# Patient Record
Sex: Female | Born: 1955 | Race: White | Hispanic: No | Marital: Married | Smoking: Never smoker
Health system: Southern US, Community
[De-identification: ages and names within clinical notes are randomized; demographics above are authoritative.]

## PROBLEM LIST (undated history)

## (undated) DIAGNOSIS — F419 Anxiety disorder, unspecified: Secondary | ICD-10-CM

## (undated) DIAGNOSIS — T8859XA Other complications of anesthesia, initial encounter: Secondary | ICD-10-CM

## (undated) DIAGNOSIS — T4145XA Adverse effect of unspecified anesthetic, initial encounter: Secondary | ICD-10-CM

## (undated) DIAGNOSIS — M1990 Unspecified osteoarthritis, unspecified site: Secondary | ICD-10-CM

## (undated) HISTORY — PX: ABDOMINAL HYSTERECTOMY: SHX81

## (undated) HISTORY — PX: TOTAL KNEE ARTHROPLASTY: SHX125

## (undated) HISTORY — PX: CHOLECYSTECTOMY: SHX55

---
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
## 1997-11-13 ENCOUNTER — Ambulatory Visit (HOSPITAL_COMMUNITY): Admission: RE | Admit: 1997-11-13 | Discharge: 1997-11-13 | Payer: Self-pay | Admitting: Family Medicine

## 1998-11-22 ENCOUNTER — Ambulatory Visit (HOSPITAL_COMMUNITY): Admission: RE | Admit: 1998-11-22 | Discharge: 1998-11-22 | Payer: Self-pay | Admitting: Obstetrics and Gynecology

## 1998-11-22 ENCOUNTER — Encounter: Payer: Self-pay | Admitting: Obstetrics and Gynecology

## 1999-05-13 ENCOUNTER — Encounter: Payer: Self-pay | Admitting: Family Medicine

## 1999-05-13 ENCOUNTER — Ambulatory Visit (HOSPITAL_COMMUNITY): Admission: RE | Admit: 1999-05-13 | Discharge: 1999-05-13 | Payer: Self-pay | Admitting: Family Medicine

## 1999-11-17 ENCOUNTER — Other Ambulatory Visit: Admission: RE | Admit: 1999-11-17 | Discharge: 1999-11-17 | Payer: Self-pay | Admitting: Obstetrics and Gynecology

## 1999-12-23 ENCOUNTER — Encounter: Payer: Self-pay | Admitting: Family Medicine

## 1999-12-23 ENCOUNTER — Ambulatory Visit (HOSPITAL_COMMUNITY): Admission: RE | Admit: 1999-12-23 | Discharge: 1999-12-23 | Payer: Self-pay | Admitting: Family Medicine

## 2000-03-12 ENCOUNTER — Encounter (INDEPENDENT_AMBULATORY_CARE_PROVIDER_SITE_OTHER): Payer: Self-pay

## 2000-03-12 ENCOUNTER — Ambulatory Visit (HOSPITAL_COMMUNITY): Admission: RE | Admit: 2000-03-12 | Discharge: 2000-03-12 | Payer: Self-pay | Admitting: Gastroenterology

## 2000-05-25 ENCOUNTER — Ambulatory Visit (HOSPITAL_COMMUNITY): Admission: RE | Admit: 2000-05-25 | Discharge: 2000-05-25 | Payer: Self-pay | Admitting: Family Medicine

## 2000-05-25 ENCOUNTER — Encounter: Payer: Self-pay | Admitting: Family Medicine

## 2000-11-19 ENCOUNTER — Other Ambulatory Visit: Admission: RE | Admit: 2000-11-19 | Discharge: 2000-11-19 | Payer: Self-pay | Admitting: Obstetrics and Gynecology

## 2000-12-24 ENCOUNTER — Ambulatory Visit (HOSPITAL_COMMUNITY): Admission: RE | Admit: 2000-12-24 | Discharge: 2000-12-24 | Payer: Self-pay | Admitting: Obstetrics and Gynecology

## 2000-12-24 ENCOUNTER — Encounter: Payer: Self-pay | Admitting: Obstetrics and Gynecology

## 2001-12-27 ENCOUNTER — Encounter: Payer: Self-pay | Admitting: Family Medicine

## 2001-12-27 ENCOUNTER — Ambulatory Visit (HOSPITAL_COMMUNITY): Admission: RE | Admit: 2001-12-27 | Discharge: 2001-12-27 | Payer: Self-pay | Admitting: Family Medicine

## 2002-06-19 ENCOUNTER — Ambulatory Visit (HOSPITAL_COMMUNITY): Admission: RE | Admit: 2002-06-19 | Discharge: 2002-06-19 | Payer: Self-pay | Admitting: Family Medicine

## 2002-06-19 ENCOUNTER — Encounter: Payer: Self-pay | Admitting: Family Medicine

## 2002-12-29 ENCOUNTER — Ambulatory Visit (HOSPITAL_COMMUNITY): Admission: RE | Admit: 2002-12-29 | Discharge: 2002-12-29 | Payer: Self-pay | Admitting: Family Medicine

## 2002-12-29 ENCOUNTER — Encounter: Payer: Self-pay | Admitting: Family Medicine

## 2004-01-05 ENCOUNTER — Ambulatory Visit (HOSPITAL_COMMUNITY): Admission: RE | Admit: 2004-01-05 | Discharge: 2004-01-05 | Payer: Self-pay | Admitting: Family Medicine

## 2005-01-05 ENCOUNTER — Ambulatory Visit (HOSPITAL_COMMUNITY): Admission: RE | Admit: 2005-01-05 | Discharge: 2005-01-05 | Payer: Self-pay | Admitting: Obstetrics and Gynecology

## 2005-01-15 ENCOUNTER — Emergency Department (HOSPITAL_COMMUNITY): Admission: EM | Admit: 2005-01-15 | Discharge: 2005-01-16 | Payer: Self-pay | Admitting: Emergency Medicine

## 2005-01-24 ENCOUNTER — Encounter (INDEPENDENT_AMBULATORY_CARE_PROVIDER_SITE_OTHER): Payer: Self-pay | Admitting: Specialist

## 2005-01-24 ENCOUNTER — Ambulatory Visit (HOSPITAL_COMMUNITY): Admission: RE | Admit: 2005-01-24 | Discharge: 2005-01-25 | Payer: Self-pay | Admitting: General Surgery

## 2006-01-08 ENCOUNTER — Ambulatory Visit (HOSPITAL_COMMUNITY): Admission: RE | Admit: 2006-01-08 | Discharge: 2006-01-08 | Payer: Self-pay | Admitting: Family Medicine

## 2006-01-19 ENCOUNTER — Encounter: Admission: RE | Admit: 2006-01-19 | Discharge: 2006-01-19 | Payer: Self-pay | Admitting: Family Medicine

## 2007-01-24 ENCOUNTER — Encounter: Admission: RE | Admit: 2007-01-24 | Discharge: 2007-01-24 | Payer: Self-pay | Admitting: Family Medicine

## 2008-01-27 ENCOUNTER — Encounter: Admission: RE | Admit: 2008-01-27 | Discharge: 2008-01-27 | Payer: Self-pay | Admitting: Family Medicine

## 2009-01-29 ENCOUNTER — Encounter: Admission: RE | Admit: 2009-01-29 | Discharge: 2009-01-29 | Payer: Self-pay | Admitting: Obstetrics and Gynecology

## 2010-01-31 ENCOUNTER — Encounter: Admission: RE | Admit: 2010-01-31 | Discharge: 2010-01-31 | Payer: Self-pay | Admitting: Obstetrics and Gynecology

## 2010-08-12 NOTE — Procedures (Signed)
[facility]  Patient:    Dorothy Garcia, Dorothy Garcia                     MRN: [id number] Proc. Date: 03/12/00 Adm. Date:  [id number] Attending:  Orland Mustard CC:         Arvella Merles, M.D.   Procedure Report  PROCEDURE:  Colonoscopy and coagulation of polyp.  MEDICATIONS:  Fentanyl 155 mcg, Versed 15 mg IV.  SCOPE:  Pediatric Olympus video colonoscope.  INDICATIONS:  A strong family history of colon polyps and cancer.  DESCRIPTION OF PROCEDURE:  The procedure had been explained to the patient and consent obtained.  With the patient in the left lateral decubitus, the pediatric video colonoscope was inserted and advanced under direct visualization.  The prep was excellent.  Using position changes and abdominal pressure, we were able to advance down to the cecum.  The right lower quadrant was transilluminated with the ileocecal valve seen.  The appendiceal orifice was seen as well as the cats claw configuration.  The scope was withdrawn. The cecum, ascending colon, hepatic flexure, transverse colon, splenic flexure, descending, and sigmoid colon were seen well upon removal with no significant diverticular disease.  Approximately 20 cm near the rectosigmoid junction, a 3-4 mm sessile polyp was encountered and was cauterized with hot biopsy forceps.  The scope was withdrawn.  The patient tolerated the procedure well and was awake and alert at the termination of procedure.  ASSESSMENT: 1. Rectal polyp. 2. Internal hemorrhoids.  PLAN:  Routine postpolypectomy instructions.  We will recommend repeating procedure in three years. DD:  03/12/00 TD:  03/13/00 Job: [id number]

## 2010-08-12 NOTE — Discharge Summary (Signed)
NAMEKRYSTYN, PICKING              ACCOUNT NO.:  [id number]   MEDICAL RECORD NO.:  [id number]            PATIENT TYPE:   LOCATION:                                 FACILITY:   PHYSICIAN:  Cherylynn Ridges, M.D.    DATE OF BIRTH:  Nov 17, 1955   DATE OF ADMISSION:  DATE OF DISCHARGE:                                 DISCHARGE SUMMARY   DISCHARGE DIAGNOSES:  1.  Symptomatic cholelithiasis, status post laparoscopic cholecystectomy.  2.  Postmenopausal, status post hysterectomy.  3.  Depression, treated.   ALLERGIES:   HOSPITAL COURSE:  Ms. Deluna is a 55 year old female who has a 2-week  history of right upper quadrant pain. Also has confirmed cholelithiasis.  She was seen in the office by Dr. Lindie Spruce on January 23, 2005 for increased  symptoms.  Because of her persistent symptoms she was scheduled for surgery  on January 24, 2005.  She was admitted to the hospital and underwent a  laparoscopic cholecystectomy by Dr. Jimmye Norman.  She was taken to the  recovery room in stable condition.  She remained in the hospital overnight  and her vital signs remained stable and she was ready for discharge to home.  She is discharged home in stable condition.   DISCHARGE MEDICATIONS:  1.  Tylenol, as needed, for pain.  2.  Vicodin if needed for pain.   FOLLOWUP:  She was given a home care instruction for laparoscopic  cholecystectomy sheet for home care information.  She is to return to the  office in 2 weeks.  I will set that appointment up with her.  She is not to  return to work until February 06, 2005.      Guy Franco, P.A.      Cherylynn Ridges, M.D.  Electronically Signed    LB/MEDQ  D:  01/25/2005  T:  01/25/2005  Job:  [id number]   cc:   Cherylynn Ridges, M.D.  [address], [address]  [place]  [place] [address]   Holley Bouche, M.D.  Fax: [phone]

## 2010-08-12 NOTE — Op Note (Signed)
NAMEVESTER, TITSWORTH              ACCOUNT NO.:  [id number]   MEDICAL RECORD NO.:  [id number]          PATIENT TYPE:  OIB   LOCATION:  6727                         FACILITY:  [facility]   PHYSICIAN:  Cherylynn Ridges, M.D.    DATE OF BIRTH:  [birth year]   DATE OF PROCEDURE:  01/24/2005  DATE OF DISCHARGE:                                 OPERATIVE REPORT   PREOPERATIVE DIAGNOSIS:  Symptomatic cholelithiasis.   POSTOPERATIVE DIAGNOSIS:  Symptomatic cholelithiasis.   OPERATION PERFORMED:  Laparoscopic cholecystectomy.   SURGEON:  Cherylynn Ridges, M.D.   ANESTHESIA:  General endotracheal.   ESTIMATED BLOOD LOSS:  Less than 20 mL.   COMPLICATIONS:  None.   CONDITION:  Stable.   INDICATIONS FOR PROCEDURE:  The patient is a 55 year old with gallstones  seen in the office by one of our partners, who comes in now for laparoscopic  cholecystectomy.   FINDINGS:  The patient had a gallbladder full of stones, no evidence of  acute inflammation.  Cholangiogram cannot be performed but the patient had  preoperative liver function tests which are normal.   DESCRIPTION OF PROCEDURE:  The patient was taken to the operating room and  placed on the table in the supine position.  After an adequate general  anesthetic was administered, she was prepped and draped in the usual sterile  manner exposing the midline and the right upper quadrant of the abdomen.   A supraumbilical curvilinear incision was made down to the midline fascia.  Through this fascia a Veress needle was passed into the peritoneal cavity  and confirmed to be in adequate position using the saline test.  Once this  was done, carbon dioxide insufflation was instilled through the Veress  needle into the peritoneal cavity up to a maximal intra-abdominal pressure  of 15 mmHg.   Once the abdomen was insufflated, we used a OptiVu 11 mm trocar and cannula  with the attached camera and light source to pass under direct vision into  the  peritoneal cavity.  With this in place, two right costal margin 5 mm  cannulas and a subxiphoid 11-12 mm cannula was passed into the peritoneal  cavity under direct vision.  With all cannulas in place, the patient was  placed in reverse Trendelenburg, the left side was tilted down and the  dissection begun.   The dome of the gallbladder was grasped using a ratcheted grasper exposing  the infundibulum and the body.  We were able to dissect away adhesions to  the infundibulum using blunt dissection and then dissect out the peritoneum  overlying the triangle of Calot and the hepatoduodenal triangle.  The cystic  duct and the cystic artery were both identified.  The cystic artery was  doubly clipped and then transected.  The cystic duct was clipped along the  gallbladder side and then an attempt to cut it for cholecystodochotomy for  the cholangiogram showed a very small if not perceptible lumen.  We could  not have any bile return.  Seemed to be almost scarred in and then it  transected, so we ended up clipping it  distally times three and then  dissecting out the gallbladder from its bed with minimal difficulty.  An  EndoCatch bag was used to bring the gallbladder out through the  supraumbilical fascia.  All counts were correct at that point.  We irrigated  the bed and found there to be adequate hemostasis, no bile drainage.  Once  we had done so we aspirated all gas and fluid and removed all cannulas.   The supraumbilical fascia was closed using a figure-of-eight stitch of 0  Vicryl and a second stitch laterally on the right side was passed in order  to close that lateral aspect.  We injected 0.25% Marcaine with epinephrine  at all sites and closed the skin using running subcuticular stitch of 4-0  Vicryl.  Sterile dressing was applied.  All sponge, needle and instrument  counts were correct at the conclusion of the case.      Cherylynn Ridges, M.D.  Electronically Signed     JOW/MEDQ   D:  01/24/2005  T:  01/25/2005  Job:  [id number]

## 2010-12-26 ENCOUNTER — Other Ambulatory Visit: Payer: Self-pay | Admitting: Obstetrics and Gynecology

## 2010-12-26 DIAGNOSIS — Z1231 Encounter for screening mammogram for malignant neoplasm of breast: Secondary | ICD-10-CM

## 2011-01-27 ENCOUNTER — Other Ambulatory Visit: Payer: Self-pay | Admitting: Gastroenterology

## 2011-02-03 ENCOUNTER — Ambulatory Visit
Admission: RE | Admit: 2011-02-03 | Discharge: 2011-02-03 | Disposition: A | Discharge status: Home / Self Care | Payer: 59 | Source: Ambulatory Visit | Attending: Obstetrics and Gynecology | Admitting: Obstetrics and Gynecology

## 2011-02-03 DIAGNOSIS — Z1231 Encounter for screening mammogram for malignant neoplasm of breast: Secondary | ICD-10-CM

## 2012-01-11 ENCOUNTER — Other Ambulatory Visit: Payer: Self-pay | Admitting: Obstetrics and Gynecology

## 2012-01-11 DIAGNOSIS — Z1231 Encounter for screening mammogram for malignant neoplasm of breast: Secondary | ICD-10-CM

## 2012-02-02 ENCOUNTER — Other Ambulatory Visit: Payer: Self-pay | Admitting: Family Medicine

## 2012-02-02 ENCOUNTER — Ambulatory Visit
Admission: RE | Admit: 2012-02-02 | Discharge: 2012-02-02 | Disposition: A | Discharge status: Home / Self Care | Payer: 59 | Source: Ambulatory Visit | Attending: Family Medicine | Admitting: Family Medicine

## 2012-02-02 DIAGNOSIS — R609 Edema, unspecified: Secondary | ICD-10-CM

## 2012-02-12 ENCOUNTER — Ambulatory Visit
Admission: RE | Admit: 2012-02-12 | Discharge: 2012-02-12 | Disposition: A | Discharge status: Home / Self Care | Payer: 59 | Source: Ambulatory Visit | Attending: Obstetrics and Gynecology | Admitting: Obstetrics and Gynecology

## 2012-02-12 DIAGNOSIS — Z1231 Encounter for screening mammogram for malignant neoplasm of breast: Secondary | ICD-10-CM

## 2013-01-24 ENCOUNTER — Other Ambulatory Visit: Payer: Self-pay

## 2013-01-24 DIAGNOSIS — Z1231 Encounter for screening mammogram for malignant neoplasm of breast: Secondary | ICD-10-CM

## 2013-02-28 ENCOUNTER — Ambulatory Visit
Admission: RE | Admit: 2013-02-28 | Discharge: 2013-02-28 | Disposition: A | Discharge status: Home / Self Care | Payer: BC Managed Care – PPO | Source: Ambulatory Visit

## 2013-02-28 DIAGNOSIS — Z1231 Encounter for screening mammogram for malignant neoplasm of breast: Secondary | ICD-10-CM

## 2013-03-04 ENCOUNTER — Other Ambulatory Visit: Payer: Self-pay | Admitting: Obstetrics and Gynecology

## 2013-03-04 DIAGNOSIS — R928 Other abnormal and inconclusive findings on diagnostic imaging of breast: Secondary | ICD-10-CM

## 2013-03-07 ENCOUNTER — Ambulatory Visit
Admission: RE | Admit: 2013-03-07 | Discharge: 2013-03-07 | Disposition: A | Discharge status: Home / Self Care | Payer: BC Managed Care – PPO | Source: Ambulatory Visit | Attending: Obstetrics and Gynecology | Admitting: Obstetrics and Gynecology

## 2013-03-07 ENCOUNTER — Other Ambulatory Visit: Payer: Self-pay | Admitting: Obstetrics and Gynecology

## 2013-03-07 DIAGNOSIS — R928 Other abnormal and inconclusive findings on diagnostic imaging of breast: Secondary | ICD-10-CM

## 2014-01-19 ENCOUNTER — Other Ambulatory Visit: Payer: Self-pay | Admitting: Obstetrics and Gynecology

## 2014-01-19 DIAGNOSIS — N6002 Solitary cyst of left breast: Secondary | ICD-10-CM

## 2014-03-02 ENCOUNTER — Ambulatory Visit
Admission: RE | Admit: 2014-03-02 | Discharge: 2014-03-02 | Disposition: A | Discharge status: Home / Self Care | Payer: BC Managed Care – PPO | Source: Ambulatory Visit | Attending: Obstetrics and Gynecology | Admitting: Obstetrics and Gynecology

## 2014-03-02 DIAGNOSIS — N6002 Solitary cyst of left breast: Secondary | ICD-10-CM

## 2014-03-09 ENCOUNTER — Other Ambulatory Visit: Payer: Self-pay | Admitting: Obstetrics and Gynecology

## 2014-03-11 LAB — CYTOLOGY - PAP

## 2014-03-27 HISTORY — PX: BREAST CYST ASPIRATION: SHX578

## 2014-12-28 ENCOUNTER — Other Ambulatory Visit: Payer: Self-pay

## 2014-12-28 DIAGNOSIS — Z1231 Encounter for screening mammogram for malignant neoplasm of breast: Secondary | ICD-10-CM

## 2015-01-12 ENCOUNTER — Other Ambulatory Visit: Payer: Self-pay

## 2015-01-12 ENCOUNTER — Other Ambulatory Visit: Payer: Self-pay | Admitting: Obstetrics and Gynecology

## 2015-01-12 DIAGNOSIS — N6002 Solitary cyst of left breast: Secondary | ICD-10-CM

## 2015-03-04 ENCOUNTER — Ambulatory Visit
Admission: RE | Admit: 2015-03-04 | Discharge: 2015-03-04 | Disposition: A | Discharge status: Home / Self Care | Payer: BLUE CROSS/BLUE SHIELD | Source: Ambulatory Visit | Attending: Obstetrics and Gynecology | Admitting: Obstetrics and Gynecology

## 2015-03-04 ENCOUNTER — Ambulatory Visit: Payer: Self-pay

## 2015-03-04 DIAGNOSIS — N6002 Solitary cyst of left breast: Secondary | ICD-10-CM

## 2015-08-02 DIAGNOSIS — G43009 Migraine without aura, not intractable, without status migrainosus: Secondary | ICD-10-CM | POA: Diagnosis not present

## 2015-09-10 DIAGNOSIS — H2513 Age-related nuclear cataract, bilateral: Secondary | ICD-10-CM | POA: Diagnosis not present

## 2015-09-10 DIAGNOSIS — H40013 Open angle with borderline findings, low risk, bilateral: Secondary | ICD-10-CM | POA: Diagnosis not present

## 2015-09-10 DIAGNOSIS — H353131 Nonexudative age-related macular degeneration, bilateral, early dry stage: Secondary | ICD-10-CM | POA: Diagnosis not present

## 2015-09-10 DIAGNOSIS — H10413 Chronic giant papillary conjunctivitis, bilateral: Secondary | ICD-10-CM | POA: Diagnosis not present

## 2015-12-16 ENCOUNTER — Other Ambulatory Visit: Payer: Self-pay | Admitting: Obstetrics and Gynecology

## 2015-12-16 DIAGNOSIS — Z1231 Encounter for screening mammogram for malignant neoplasm of breast: Secondary | ICD-10-CM

## 2015-12-21 DIAGNOSIS — N6009 Solitary cyst of unspecified breast: Secondary | ICD-10-CM | POA: Diagnosis not present

## 2015-12-22 ENCOUNTER — Other Ambulatory Visit: Payer: Self-pay | Admitting: Obstetrics and Gynecology

## 2015-12-22 DIAGNOSIS — N644 Mastodynia: Secondary | ICD-10-CM

## 2015-12-28 ENCOUNTER — Other Ambulatory Visit: Payer: BLUE CROSS/BLUE SHIELD

## 2016-01-07 DIAGNOSIS — F430 Acute stress reaction: Secondary | ICD-10-CM | POA: Diagnosis not present

## 2016-01-07 DIAGNOSIS — L298 Other pruritus: Secondary | ICD-10-CM | POA: Diagnosis not present

## 2016-01-07 DIAGNOSIS — L299 Pruritus, unspecified: Secondary | ICD-10-CM | POA: Diagnosis not present

## 2016-01-07 DIAGNOSIS — L309 Dermatitis, unspecified: Secondary | ICD-10-CM | POA: Diagnosis not present

## 2016-02-22 DIAGNOSIS — E7800 Pure hypercholesterolemia, unspecified: Secondary | ICD-10-CM | POA: Diagnosis not present

## 2016-02-22 DIAGNOSIS — Z0000 Encounter for general adult medical examination without abnormal findings: Secondary | ICD-10-CM | POA: Diagnosis not present

## 2016-03-06 ENCOUNTER — Ambulatory Visit
Admission: RE | Admit: 2016-03-06 | Discharge: 2016-03-06 | Disposition: A | Discharge status: Home / Self Care | Payer: BLUE CROSS/BLUE SHIELD | Source: Ambulatory Visit | Attending: Obstetrics and Gynecology | Admitting: Obstetrics and Gynecology

## 2016-03-06 DIAGNOSIS — N644 Mastodynia: Secondary | ICD-10-CM

## 2016-03-06 DIAGNOSIS — N6002 Solitary cyst of left breast: Secondary | ICD-10-CM | POA: Diagnosis not present

## 2016-03-30 DIAGNOSIS — Z6829 Body mass index (BMI) 29.0-29.9, adult: Secondary | ICD-10-CM | POA: Diagnosis not present

## 2016-03-30 DIAGNOSIS — Z01419 Encounter for gynecological examination (general) (routine) without abnormal findings: Secondary | ICD-10-CM | POA: Diagnosis not present

## 2016-05-30 DIAGNOSIS — R002 Palpitations: Secondary | ICD-10-CM | POA: Diagnosis not present

## 2016-06-08 DIAGNOSIS — Z86010 Personal history of colonic polyps: Secondary | ICD-10-CM | POA: Diagnosis not present

## 2016-06-08 DIAGNOSIS — K640 First degree hemorrhoids: Secondary | ICD-10-CM | POA: Diagnosis not present

## 2016-10-30 DIAGNOSIS — M79671 Pain in right foot: Secondary | ICD-10-CM | POA: Diagnosis not present

## 2016-10-30 DIAGNOSIS — M79672 Pain in left foot: Secondary | ICD-10-CM | POA: Diagnosis not present

## 2016-11-16 DIAGNOSIS — M7661 Achilles tendinitis, right leg: Secondary | ICD-10-CM | POA: Diagnosis not present

## 2016-11-16 DIAGNOSIS — M7662 Achilles tendinitis, left leg: Secondary | ICD-10-CM | POA: Diagnosis not present

## 2016-11-29 DIAGNOSIS — M7661 Achilles tendinitis, right leg: Secondary | ICD-10-CM | POA: Diagnosis not present

## 2016-11-29 DIAGNOSIS — M7662 Achilles tendinitis, left leg: Secondary | ICD-10-CM | POA: Diagnosis not present

## 2016-12-04 DIAGNOSIS — M7662 Achilles tendinitis, left leg: Secondary | ICD-10-CM | POA: Diagnosis not present

## 2016-12-04 DIAGNOSIS — M7661 Achilles tendinitis, right leg: Secondary | ICD-10-CM | POA: Diagnosis not present

## 2016-12-07 DIAGNOSIS — M7662 Achilles tendinitis, left leg: Secondary | ICD-10-CM | POA: Diagnosis not present

## 2016-12-07 DIAGNOSIS — M7661 Achilles tendinitis, right leg: Secondary | ICD-10-CM | POA: Diagnosis not present

## 2016-12-11 DIAGNOSIS — M7661 Achilles tendinitis, right leg: Secondary | ICD-10-CM | POA: Diagnosis not present

## 2016-12-11 DIAGNOSIS — M7662 Achilles tendinitis, left leg: Secondary | ICD-10-CM | POA: Diagnosis not present

## 2016-12-14 DIAGNOSIS — M7661 Achilles tendinitis, right leg: Secondary | ICD-10-CM | POA: Diagnosis not present

## 2016-12-14 DIAGNOSIS — M7662 Achilles tendinitis, left leg: Secondary | ICD-10-CM | POA: Diagnosis not present

## 2016-12-18 DIAGNOSIS — M7661 Achilles tendinitis, right leg: Secondary | ICD-10-CM | POA: Diagnosis not present

## 2016-12-18 DIAGNOSIS — M7662 Achilles tendinitis, left leg: Secondary | ICD-10-CM | POA: Diagnosis not present

## 2016-12-22 DIAGNOSIS — M7662 Achilles tendinitis, left leg: Secondary | ICD-10-CM | POA: Diagnosis not present

## 2016-12-22 DIAGNOSIS — M7661 Achilles tendinitis, right leg: Secondary | ICD-10-CM | POA: Diagnosis not present

## 2016-12-25 DIAGNOSIS — M7661 Achilles tendinitis, right leg: Secondary | ICD-10-CM | POA: Diagnosis not present

## 2016-12-25 DIAGNOSIS — M7662 Achilles tendinitis, left leg: Secondary | ICD-10-CM | POA: Diagnosis not present

## 2016-12-28 DIAGNOSIS — M7662 Achilles tendinitis, left leg: Secondary | ICD-10-CM | POA: Diagnosis not present

## 2016-12-28 DIAGNOSIS — M7661 Achilles tendinitis, right leg: Secondary | ICD-10-CM | POA: Diagnosis not present

## 2017-01-17 DIAGNOSIS — M7662 Achilles tendinitis, left leg: Secondary | ICD-10-CM | POA: Diagnosis not present

## 2017-01-17 DIAGNOSIS — M7661 Achilles tendinitis, right leg: Secondary | ICD-10-CM | POA: Diagnosis not present

## 2017-01-19 DIAGNOSIS — J0100 Acute maxillary sinusitis, unspecified: Secondary | ICD-10-CM | POA: Diagnosis not present

## 2017-01-19 DIAGNOSIS — R030 Elevated blood-pressure reading, without diagnosis of hypertension: Secondary | ICD-10-CM | POA: Diagnosis not present

## 2017-01-25 ENCOUNTER — Other Ambulatory Visit: Payer: Self-pay | Admitting: Obstetrics and Gynecology

## 2017-01-25 DIAGNOSIS — Z1231 Encounter for screening mammogram for malignant neoplasm of breast: Secondary | ICD-10-CM

## 2017-02-05 DIAGNOSIS — M549 Dorsalgia, unspecified: Secondary | ICD-10-CM | POA: Diagnosis not present

## 2017-02-05 DIAGNOSIS — N898 Other specified noninflammatory disorders of vagina: Secondary | ICD-10-CM | POA: Diagnosis not present

## 2017-03-07 ENCOUNTER — Ambulatory Visit: Payer: BLUE CROSS/BLUE SHIELD

## 2017-03-28 DIAGNOSIS — J019 Acute sinusitis, unspecified: Secondary | ICD-10-CM | POA: Diagnosis not present

## 2017-04-02 DIAGNOSIS — Z01419 Encounter for gynecological examination (general) (routine) without abnormal findings: Secondary | ICD-10-CM | POA: Diagnosis not present

## 2017-04-02 DIAGNOSIS — Z6830 Body mass index (BMI) 30.0-30.9, adult: Secondary | ICD-10-CM | POA: Diagnosis not present

## 2017-04-02 DIAGNOSIS — Z13820 Encounter for screening for osteoporosis: Secondary | ICD-10-CM | POA: Diagnosis not present

## 2017-04-09 ENCOUNTER — Ambulatory Visit
Admission: RE | Admit: 2017-04-09 | Discharge: 2017-04-09 | Disposition: A | Discharge status: Home / Self Care | Payer: BLUE CROSS/BLUE SHIELD | Source: Ambulatory Visit | Attending: Obstetrics and Gynecology | Admitting: Obstetrics and Gynecology

## 2017-04-09 DIAGNOSIS — Z1231 Encounter for screening mammogram for malignant neoplasm of breast: Secondary | ICD-10-CM

## 2017-05-22 DIAGNOSIS — Z0000 Encounter for general adult medical examination without abnormal findings: Secondary | ICD-10-CM | POA: Diagnosis not present

## 2017-05-22 DIAGNOSIS — E7800 Pure hypercholesterolemia, unspecified: Secondary | ICD-10-CM | POA: Diagnosis not present

## 2017-06-07 DIAGNOSIS — N760 Acute vaginitis: Secondary | ICD-10-CM | POA: Diagnosis not present

## 2017-09-26 DIAGNOSIS — N760 Acute vaginitis: Secondary | ICD-10-CM | POA: Diagnosis not present

## 2018-01-10 DIAGNOSIS — S8391XA Sprain of unspecified site of right knee, initial encounter: Secondary | ICD-10-CM | POA: Diagnosis not present

## 2018-01-11 DIAGNOSIS — M25561 Pain in right knee: Secondary | ICD-10-CM | POA: Diagnosis not present

## 2018-01-11 DIAGNOSIS — M1711 Unilateral primary osteoarthritis, right knee: Secondary | ICD-10-CM | POA: Diagnosis not present

## 2018-02-28 ENCOUNTER — Other Ambulatory Visit: Payer: Self-pay | Admitting: Obstetrics and Gynecology

## 2018-02-28 DIAGNOSIS — Z1231 Encounter for screening mammogram for malignant neoplasm of breast: Secondary | ICD-10-CM

## 2018-04-08 DIAGNOSIS — Z01419 Encounter for gynecological examination (general) (routine) without abnormal findings: Secondary | ICD-10-CM | POA: Diagnosis not present

## 2018-04-08 DIAGNOSIS — Z6831 Body mass index (BMI) 31.0-31.9, adult: Secondary | ICD-10-CM | POA: Diagnosis not present

## 2018-04-15 ENCOUNTER — Ambulatory Visit
Admission: RE | Admit: 2018-04-15 | Discharge: 2018-04-15 | Disposition: A | Discharge status: Home / Self Care | Payer: BLUE CROSS/BLUE SHIELD | Source: Ambulatory Visit | Attending: Obstetrics and Gynecology | Admitting: Obstetrics and Gynecology

## 2018-04-15 DIAGNOSIS — Z1231 Encounter for screening mammogram for malignant neoplasm of breast: Secondary | ICD-10-CM | POA: Diagnosis not present

## 2018-04-25 DIAGNOSIS — H353131 Nonexudative age-related macular degeneration, bilateral, early dry stage: Secondary | ICD-10-CM | POA: Diagnosis not present

## 2018-04-25 DIAGNOSIS — H40013 Open angle with borderline findings, low risk, bilateral: Secondary | ICD-10-CM | POA: Diagnosis not present

## 2018-04-25 DIAGNOSIS — H10413 Chronic giant papillary conjunctivitis, bilateral: Secondary | ICD-10-CM | POA: Diagnosis not present

## 2018-04-25 DIAGNOSIS — H2513 Age-related nuclear cataract, bilateral: Secondary | ICD-10-CM | POA: Diagnosis not present

## 2018-05-07 DIAGNOSIS — B078 Other viral warts: Secondary | ICD-10-CM | POA: Diagnosis not present

## 2018-09-03 DIAGNOSIS — J209 Acute bronchitis, unspecified: Secondary | ICD-10-CM | POA: Diagnosis not present

## 2018-09-03 DIAGNOSIS — R6889 Other general symptoms and signs: Secondary | ICD-10-CM | POA: Diagnosis not present

## 2018-10-25 DIAGNOSIS — M1712 Unilateral primary osteoarthritis, left knee: Secondary | ICD-10-CM | POA: Diagnosis not present

## 2018-10-25 DIAGNOSIS — M25562 Pain in left knee: Secondary | ICD-10-CM | POA: Diagnosis not present

## 2018-10-25 DIAGNOSIS — M170 Bilateral primary osteoarthritis of knee: Secondary | ICD-10-CM | POA: Diagnosis not present

## 2018-11-08 ENCOUNTER — Other Ambulatory Visit (HOSPITAL_COMMUNITY): Payer: Self-pay | Admitting: Obstetrics and Gynecology

## 2018-11-08 ENCOUNTER — Other Ambulatory Visit: Payer: Self-pay

## 2018-11-08 ENCOUNTER — Ambulatory Visit (HOSPITAL_COMMUNITY)
Admission: RE | Admit: 2018-11-08 | Discharge: 2018-11-08 | Disposition: A | Discharge status: Home / Self Care | Payer: BC Managed Care – PPO | Source: Ambulatory Visit | Attending: Family Medicine | Admitting: Family Medicine

## 2018-11-08 ENCOUNTER — Other Ambulatory Visit (HOSPITAL_COMMUNITY): Payer: Self-pay | Admitting: Orthopedic Surgery

## 2018-11-08 ENCOUNTER — Other Ambulatory Visit (HOSPITAL_COMMUNITY): Payer: Self-pay | Admitting: Specialist

## 2018-11-08 DIAGNOSIS — M1712 Unilateral primary osteoarthritis, left knee: Secondary | ICD-10-CM | POA: Diagnosis not present

## 2018-11-08 DIAGNOSIS — M7989 Other specified soft tissue disorders: Secondary | ICD-10-CM

## 2018-11-08 DIAGNOSIS — M79605 Pain in left leg: Secondary | ICD-10-CM | POA: Insufficient documentation

## 2018-11-08 NOTE — Progress Notes (Signed)
Left lower extremity venous duplex has been completed. Preliminary results can be found in CV Proc through chart review.  Results were faxed to Dr. Reather Littler office.  11/08/18 3:59 PM Dorothy Garcia RVT

## 2018-12-18 DIAGNOSIS — M25562 Pain in left knee: Secondary | ICD-10-CM | POA: Diagnosis not present

## 2018-12-18 DIAGNOSIS — M179 Osteoarthritis of knee, unspecified: Secondary | ICD-10-CM | POA: Diagnosis not present

## 2019-01-08 DIAGNOSIS — E7800 Pure hypercholesterolemia, unspecified: Secondary | ICD-10-CM | POA: Diagnosis not present

## 2019-01-08 DIAGNOSIS — M25562 Pain in left knee: Secondary | ICD-10-CM | POA: Diagnosis not present

## 2019-01-08 DIAGNOSIS — K219 Gastro-esophageal reflux disease without esophagitis: Secondary | ICD-10-CM | POA: Diagnosis not present

## 2019-01-08 DIAGNOSIS — Z0000 Encounter for general adult medical examination without abnormal findings: Secondary | ICD-10-CM | POA: Diagnosis not present

## 2019-01-08 DIAGNOSIS — M1712 Unilateral primary osteoarthritis, left knee: Secondary | ICD-10-CM | POA: Diagnosis not present

## 2019-01-08 DIAGNOSIS — F419 Anxiety disorder, unspecified: Secondary | ICD-10-CM | POA: Diagnosis not present

## 2019-01-30 ENCOUNTER — Other Ambulatory Visit: Payer: Self-pay

## 2019-01-30 DIAGNOSIS — Z20822 Contact with and (suspected) exposure to covid-19: Secondary | ICD-10-CM

## 2019-01-31 LAB — NOVEL CORONAVIRUS, NAA: SARS-CoV-2, NAA: NOT DETECTED

## 2019-02-07 ENCOUNTER — Other Ambulatory Visit: Payer: Self-pay

## 2019-02-07 DIAGNOSIS — Z20822 Contact with and (suspected) exposure to covid-19: Secondary | ICD-10-CM

## 2019-02-10 ENCOUNTER — Telehealth: Payer: Self-pay | Admitting: *Deleted

## 2019-02-10 LAB — NOVEL CORONAVIRUS, NAA: SARS-CoV-2, NAA: NOT DETECTED

## 2019-02-10 NOTE — Telephone Encounter (Signed)
Patient given negative COVID results

## 2019-02-18 DIAGNOSIS — M25562 Pain in left knee: Secondary | ICD-10-CM | POA: Diagnosis not present

## 2019-02-18 DIAGNOSIS — M1712 Unilateral primary osteoarthritis, left knee: Secondary | ICD-10-CM | POA: Diagnosis not present

## 2019-02-18 DIAGNOSIS — M25662 Stiffness of left knee, not elsewhere classified: Secondary | ICD-10-CM | POA: Diagnosis not present

## 2019-02-28 DIAGNOSIS — E7800 Pure hypercholesterolemia, unspecified: Secondary | ICD-10-CM | POA: Diagnosis not present

## 2019-02-28 DIAGNOSIS — M1711 Unilateral primary osteoarthritis, right knee: Secondary | ICD-10-CM | POA: Diagnosis not present

## 2019-03-04 DIAGNOSIS — B079 Viral wart, unspecified: Secondary | ICD-10-CM | POA: Diagnosis not present

## 2019-03-04 DIAGNOSIS — Z23 Encounter for immunization: Secondary | ICD-10-CM | POA: Diagnosis not present

## 2019-03-11 ENCOUNTER — Other Ambulatory Visit: Payer: Self-pay | Admitting: Obstetrics and Gynecology

## 2019-03-11 DIAGNOSIS — Z1231 Encounter for screening mammogram for malignant neoplasm of breast: Secondary | ICD-10-CM

## 2019-03-17 DIAGNOSIS — M1712 Unilateral primary osteoarthritis, left knee: Secondary | ICD-10-CM | POA: Diagnosis not present

## 2019-03-25 DIAGNOSIS — M25561 Pain in right knee: Secondary | ICD-10-CM | POA: Diagnosis not present

## 2019-03-26 DIAGNOSIS — M25561 Pain in right knee: Secondary | ICD-10-CM | POA: Diagnosis not present

## 2019-04-11 DIAGNOSIS — M25561 Pain in right knee: Secondary | ICD-10-CM | POA: Diagnosis not present

## 2019-04-21 DIAGNOSIS — M25561 Pain in right knee: Secondary | ICD-10-CM | POA: Diagnosis not present

## 2019-04-21 DIAGNOSIS — Z471 Aftercare following joint replacement surgery: Secondary | ICD-10-CM | POA: Diagnosis not present

## 2019-04-21 DIAGNOSIS — Z96652 Presence of left artificial knee joint: Secondary | ICD-10-CM | POA: Diagnosis not present

## 2019-04-21 DIAGNOSIS — Z96659 Presence of unspecified artificial knee joint: Secondary | ICD-10-CM | POA: Diagnosis not present

## 2019-04-23 ENCOUNTER — Ambulatory Visit: Payer: PPO | Attending: Internal Medicine

## 2019-04-23 DIAGNOSIS — U071 COVID-19: Secondary | ICD-10-CM | POA: Insufficient documentation

## 2019-04-23 DIAGNOSIS — Z20822 Contact with and (suspected) exposure to covid-19: Secondary | ICD-10-CM

## 2019-04-24 ENCOUNTER — Telehealth: Payer: Self-pay

## 2019-04-24 LAB — NOVEL CORONAVIRUS, NAA: SARS-CoV-2, NAA: DETECTED — AB

## 2019-04-24 NOTE — Telephone Encounter (Signed)
Patient called in requesting [place] lab results - DOB/Address verified  - Positive results give, no further questions.

## 2019-04-25 ENCOUNTER — Telehealth: Payer: Self-pay | Admitting: Infectious Diseases

## 2019-04-25 NOTE — Telephone Encounter (Signed)
Called to discuss with patient about Covid symptoms and the use of bamlanivimab, a monoclonal antibody infusion for those with mild to moderate Covid symptoms and at a high risk of hospitalization.  Pt is qualified for this infusion at the [facility] infusion center due to Age > 65   She is recovering from knee replacement surgery currently. She has had nausea and diarrhea all this week (started Monday) that she thought was due to the pain medications she was receiving. She now has from 3 days had headaches, nasal congestion, fatigue. No coughing at present. Diarrhea has resolved and she is starting to feel a little better. Her mother is also COVID+ and getting monoclonal antibody infusion therapy today.   She would like to think about it and discuss with her husband. She will call back to the infusion clinic with decision should she want to proceed for Monday appt. Understands that she needs to be within 10 days and cannot be too sick.

## 2019-04-28 ENCOUNTER — Ambulatory Visit: Payer: BC Managed Care – PPO

## 2019-05-02 DIAGNOSIS — M25662 Stiffness of left knee, not elsewhere classified: Secondary | ICD-10-CM | POA: Diagnosis not present

## 2019-05-02 DIAGNOSIS — M25562 Pain in left knee: Secondary | ICD-10-CM | POA: Diagnosis not present

## 2019-05-12 ENCOUNTER — Ambulatory Visit
Admission: RE | Admit: 2019-05-12 | Discharge: 2019-05-12 | Disposition: A | Discharge status: Home / Self Care | Payer: PPO | Source: Ambulatory Visit | Attending: Obstetrics and Gynecology | Admitting: Obstetrics and Gynecology

## 2019-05-12 ENCOUNTER — Other Ambulatory Visit: Payer: Self-pay

## 2019-05-12 DIAGNOSIS — Z1231 Encounter for screening mammogram for malignant neoplasm of breast: Secondary | ICD-10-CM | POA: Diagnosis not present

## 2019-06-12 DIAGNOSIS — Z779 Other contact with and (suspected) exposures hazardous to health: Secondary | ICD-10-CM | POA: Diagnosis not present

## 2019-06-12 DIAGNOSIS — R309 Painful micturition, unspecified: Secondary | ICD-10-CM | POA: Diagnosis not present

## 2019-06-12 DIAGNOSIS — N959 Unspecified menopausal and perimenopausal disorder: Secondary | ICD-10-CM | POA: Diagnosis not present

## 2019-06-12 DIAGNOSIS — N760 Acute vaginitis: Secondary | ICD-10-CM | POA: Diagnosis not present

## 2019-06-12 DIAGNOSIS — Z6831 Body mass index (BMI) 31.0-31.9, adult: Secondary | ICD-10-CM | POA: Diagnosis not present

## 2019-06-12 DIAGNOSIS — Z124 Encounter for screening for malignant neoplasm of cervix: Secondary | ICD-10-CM | POA: Diagnosis not present

## 2019-07-25 ENCOUNTER — Ambulatory Visit: Payer: PPO | Attending: Internal Medicine

## 2019-07-25 DIAGNOSIS — Z23 Encounter for immunization: Secondary | ICD-10-CM

## 2019-07-25 NOTE — Progress Notes (Signed)
   Covid-19 Vaccination Clinic  Name:  Dorothy Garcia    MRN: [id number] DOB: 1955/07/21  07/25/2019  Dorothy Garcia was observed post Covid-19 immunization for 30 minutes based on pre-vaccination screening without incident. She was provided with Vaccine Information Sheet and instruction to access the V-Safe system.   Dorothy Garcia was instructed to call 911 with any severe reactions post vaccine: Marland Kitchen Difficulty breathing  . Swelling of face and throat  . A fast heartbeat  . A bad rash all over body  . Dizziness and weakness   Immunizations Administered    Name Date Dose VIS Date Route   Pfizer COVID-19 Vaccine 07/25/2019  3:02 PM 0.3 mL 05/21/2018 Intramuscular   Manufacturer: [place]   Lot: [id number]   [place]: [id number]

## 2019-08-18 ENCOUNTER — Ambulatory Visit: Payer: PPO | Attending: Internal Medicine

## 2019-08-18 DIAGNOSIS — Z23 Encounter for immunization: Secondary | ICD-10-CM

## 2019-08-18 NOTE — Progress Notes (Signed)
   Covid-19 Vaccination Clinic  Name:  Dorothy Garcia    MRN: [id number] DOB: Mar 30, 1955  08/18/2019  Dorothy Garcia was observed post Covid-19 immunization for 30 minutes based on pre-vaccination screening without incident. She was provided with Vaccine Information Sheet and instruction to access the V-Safe system.   Dorothy Garcia was instructed to call 911 with any severe reactions post vaccine: Marland Kitchen Difficulty breathing  . Swelling of face and throat  . A fast heartbeat  . A bad rash all over body  . Dizziness and weakness   Immunizations Administered    Name Date Dose VIS Date Route   Pfizer COVID-19 Vaccine 08/18/2019  9:58 AM 0.3 mL 05/21/2018 Intramuscular   Manufacturer: [place]   Lot: [id number]   [place]: [id number]

## 2019-09-24 DIAGNOSIS — Z471 Aftercare following joint replacement surgery: Secondary | ICD-10-CM | POA: Diagnosis not present

## 2019-09-24 DIAGNOSIS — M25561 Pain in right knee: Secondary | ICD-10-CM | POA: Diagnosis not present

## 2019-09-24 DIAGNOSIS — Z96652 Presence of left artificial knee joint: Secondary | ICD-10-CM | POA: Diagnosis not present

## 2019-09-24 DIAGNOSIS — M1711 Unilateral primary osteoarthritis, right knee: Secondary | ICD-10-CM | POA: Diagnosis not present

## 2019-10-20 DIAGNOSIS — G43009 Migraine without aura, not intractable, without status migrainosus: Secondary | ICD-10-CM | POA: Diagnosis not present

## 2019-10-20 DIAGNOSIS — E7800 Pure hypercholesterolemia, unspecified: Secondary | ICD-10-CM | POA: Diagnosis not present

## 2019-10-20 DIAGNOSIS — M1711 Unilateral primary osteoarthritis, right knee: Secondary | ICD-10-CM | POA: Diagnosis not present

## 2019-11-03 NOTE — H&P (Signed)
TOTAL KNEE ADMISSION H&P  Patient is being admitted for right total knee arthroplasty.  Subjective:  Chief Complaint:   Right knee OA / pain  HPI: Dorothy Garcia, 64 y.o. female, has a history of pain and functional disability in the right knee due to arthritis and has failed non-surgical conservative treatments for greater than 12 weeks to include NSAID's and/or analgesics, use of assistive devices and activity modification.  Onset of symptoms was gradual, starting < 1 year ago with rapidlly worsening course since that time. The patient noted prior procedures on the knee to include  arthroplasty on the left knee.  Patient currently rates pain in the right knee at 4 out of 10 with activity. Patient has worsening of pain with activity and weight bearing, pain that interferes with activities of daily living, pain with passive range of motion, crepitus and joint swelling.  Patient has evidence of periarticular osteophytes and joint space narrowing by imaging studies. There is no active infection.  Risks, benefits and expectations were discussed with the patient.  Risks including but not limited to the risk of anesthesia, blood clots, nerve damage, blood vessel damage, failure of the prosthesis, infection and up to and including death.  Patient understand the risks, benefits and expectations and wishes to proceed with surgery.   PCP: Shirline Frees, MD  D/C Plans:       Home (same day)  Post-op Meds:       Rx given for ASA, Dilaudid, Colace and MiraLax  Tranexamic Acid:      To be given - IV   Decadron:      Is to be given  FYI:      ASA  Dilaudid  DME:   Pt already has equipment  PT:   OPPT @ [facility]  Pharmacy: [facility], [place], [place]   Past Medical History:  Diagnosis Date  . Anxiety   . Arthritis   . Complication of anesthesia    panic attack after surgery.     Past Surgical History:  Procedure Laterality Date  . ABDOMINAL HYSTERECTOMY    . BREAST CYST  ASPIRATION Left 2016  . CHOLECYSTECTOMY    . TOTAL KNEE ARTHROPLASTY Left     No current facility-administered medications for this encounter.   Current Outpatient Medications  Medication Sig Dispense Refill Last Dose  . acetaminophen (TYLENOL) 500 MG tablet Take 500-1,000 mg by mouth every 8 (eight) hours as needed for moderate pain.     Marland Kitchen ALPRAZolam (XANAX) 1 MG tablet Take 0.5 mg by mouth 2 (two) times daily.     Marland Kitchen estradiol (ESTRACE) 1 MG tablet Take 1 mg by mouth daily.     . fluticasone (FLONASE) 50 MCG/ACT nasal spray Place 1 spray into both nostrils daily.     Marland Kitchen loratadine (CLARITIN) 10 MG tablet Take 10 mg by mouth daily.     . meloxicam (MOBIC) 7.5 MG tablet Take 7.5 mg by mouth daily as needed for pain.     Marland Kitchen omeprazole (PRILOSEC) 20 MG capsule Take 20 mg by mouth daily.     . sertraline (ZOLOFT) 25 MG tablet Take 12.5 mg by mouth daily.      Allergies  Allergen Reactions  . Other Anaphylaxis    All nuts   . Shellfish Allergy Anaphylaxis  . Egg [Eggs Or Egg-Derived Products] Swelling  . Codeine Rash  . Latex Rash    Social History   Tobacco Use  . Smoking status: Never  Smoker  . Smokeless tobacco: Never Used  Substance Use Topics  . Alcohol use: Never    Family History  Problem Relation Age of Onset  . Breast cancer Neg Hx       Review of Systems  Constitutional: Negative.   HENT: Negative.   Eyes: Negative.   Respiratory: Positive for cough.   Cardiovascular: Negative.   Gastrointestinal: Positive for heartburn.  Genitourinary: Negative.   Musculoskeletal: Positive for joint pain.  Skin: Negative.   Neurological: Negative.   Endo/Heme/Allergies: Positive for environmental allergies.  Psychiatric/Behavioral: The patient is nervous/anxious.      Objective:  Physical Exam Constitutional:      Appearance: She is well-developed.  HENT:     Head: Normocephalic.  Eyes:     Pupils: Pupils are equal, round, and reactive to light.  Neck:      Thyroid: No thyromegaly.     Vascular: No JVD.     Trachea: No tracheal deviation.  Cardiovascular:     Rate and Rhythm: Normal rate and regular rhythm.  Pulmonary:     Effort: Pulmonary effort is normal. No respiratory distress.     Breath sounds: Normal breath sounds. No wheezing.  Abdominal:     Palpations: Abdomen is soft.     Tenderness: There is no abdominal tenderness. There is no guarding.  Musculoskeletal:     Cervical back: Neck supple.     Right knee: Swelling and bony tenderness present. No erythema or ecchymosis. Decreased range of motion. Tenderness present.  Lymphadenopathy:     Cervical: No cervical adenopathy.  Skin:    General: Skin is warm and dry.  Neurological:     Mental Status: She is alert and oriented to person, place, and time.       Imaging Review Plain radiographs demonstrate severe degenerative joint disease of the right knee(s).  The bone quality appears to be good for age and reported activity level.      Assessment/Plan:  End stage arthritis, right knee   The patient history, physical examination, clinical judgment of the provider and imaging studies are consistent with end stage degenerative joint disease of the right knee and total knee arthroplasty is deemed medically necessary. The treatment options including medical management, injection therapy arthroscopy and arthroplasty were discussed at length. The risks and benefits of total knee arthroplasty were presented and reviewed. The risks due to aseptic loosening, infection, stiffness, patella tracking problems, thromboembolic complications and other imponderables were discussed. The patient acknowledged the explanation, agreed to proceed with the plan and consent was signed. Patient is being admitted for treatment for surgery, pain control, PT, OT, prophylactic antibiotics, VTE prophylaxis, progressive ambulation and ADL's and discharge planning. The patient is planning to be discharged  home.     Patient's anticipated LOS is less than 2 midnights, meeting these requirements: - Lives within 1 hour of care - Has a competent adult at home to recover with post-op recover - NO history of  - Chronic pain requiring opiods  - Diabetes  - Coronary Artery Disease  - Heart failure  - Heart attack  - Stroke  - DVT/VTE  - Cardiac arrhythmia  - Respiratory Failure/COPD  - Renal failure  - Anemia  - Advanced Liver disease     [place]. Janet Humphreys   PA-C  11/11/2019, 8:33 AM

## 2019-11-07 NOTE — Patient Instructions (Addendum)
DUE TO COVID-19 ONLY ONE VISITOR IS ALLOWED TO COME WITH YOU AND STAY IN THE WAITING ROOM ONLY DURING PRE OP AND PROCEDURE DAY OF SURGERY. THE 1 VISITOR  MAY VISIT WITH YOU AFTER SURGERY IN YOUR PRIVATE ROOM DURING VISITING HOURS ONLY!  YOU NEED TO HAVE A COVID 19 TEST ON: 11/14/19 @ 9:00 am  , THIS TEST MUST BE DONE BEFORE SURGERY,  COVID TESTING SITE [place] [id number], IT IS ON THE RIGHT GOING OUT [address] APPROXIMATELY  2 MINUTES PAST [employer] ON THE RIGHT. ONCE YOUR COVID TEST IS COMPLETED,  PLEASE BEGIN THE QUARANTINE INSTRUCTIONS AS OUTLINED IN YOUR HANDOUT.                Dorothy Garcia    Your procedure is scheduled on: 11/18/19   Report to [facility] Main  Entrance   Report to short stay at: 5:30 AM     Call this number if you have problems the morning of surgery [phone]    Remember:   NO SOLID FOOD AFTER MIDNIGHT THE NIGHT PRIOR TO SURGERY. NOTHING BY MOUTH EXCEPT CLEAR LIQUIDS UNTIL: 4:15 am . PLEASE FINISH ENSURE DRINK PER SURGEON ORDER  WHICH NEEDS TO BE COMPLETED AT: 4:15 am .   CLEAR LIQUID DIET   Foods Allowed                                                                     Foods Excluded  Coffee and tea, regular and decaf                             liquids that you cannot  Plain Jell-O any favor except red or purple                                           see through such as: Fruit ices (not with fruit pulp)                                     milk, soups, orange juice  Iced Popsicles                                    All solid food Carbonated beverages, regular and diet                                    Cranberry, grape and apple juices Sports drinks like Gatorade Lightly seasoned clear broth or consume(fat free) Sugar, honey syrup  Sample Menu Breakfast                                Lunch  Supper Cranberry juice                    Beef broth                             Chicken broth Jell-O                                     Grape juice                           Apple juice Coffee or tea                        Jell-O                                      Popsicle                                                Coffee or tea                        Coffee or tea  _____________________________________________________________________   BRUSH YOUR TEETH MORNING OF SURGERY AND RINSE YOUR MOUTH OUT, NO CHEWING GUM CANDY OR MINTS.     Take these medicines the morning of surgery with A SIP OF WATER: alprazolam,estradiol,loratadine,omeprazole,sertraline.Use Flonase as usual.                                 You may not have any metal on your body including hair pins and              piercings  Do not wear jewelry, make-up, lotions, powders or perfumes, deodorant             Do not wear nail polish on your fingernails.  Do not shave  48 hours prior to surgery.    Do not bring valuables to the hospital. [place].  Contacts, dentures or bridgework may not be worn into surgery.  Leave suitcase in the car. After surgery it may be brought to your room.     Patients discharged the day of surgery will not be allowed to drive home. IF YOU ARE HAVING SURGERY AND GOING HOME THE SAME DAY, YOU MUST HAVE AN ADULT TO DRIVE YOU HOME AND BE WITH YOU FOR 24 HOURS. YOU MAY GO HOME BY TAXI OR UBER OR ORTHERWISE, BUT AN ADULT MUST ACCOMPANY YOU HOME AND STAY WITH YOU FOR 24 HOURS.  Name and phone number of your driver:  Special Instructions: N/A              Please read over the following fact sheets you were given: _____________________________________________________________________       [facility] - Preparing for Surgery Before surgery, you can play an important role.  Because skin is not sterile, your skin needs to be as free of germs as  possible.  You can reduce the number of germs on your skin by washing with CHG  (chlorahexidine gluconate) soap before surgery.  CHG is an antiseptic cleaner which kills germs and bonds with the skin to continue killing germs even after washing. Please DO NOT use if you have an allergy to CHG or antibacterial soaps.  If your skin becomes reddened/irritated stop using the CHG and inform your nurse when you arrive at Short Stay. Do not shave (including legs and underarms) for at least 48 hours prior to the first CHG shower.  You may shave your face/neck. Please follow these instructions carefully:  1.  Shower with CHG Soap the night before surgery and the  morning of Surgery.  2.  If you choose to wash your hair, wash your hair first as usual with your  normal  shampoo.  3.  After you shampoo, rinse your hair and body thoroughly to remove the  shampoo.                           4.  Use CHG as you would any other liquid soap.  You can apply chg directly  to the skin and wash                       Gently with a scrungie or clean washcloth.  5.  Apply the CHG Soap to your body ONLY FROM THE NECK DOWN.   Do not use on face/ open                           Wound or open sores. Avoid contact with eyes, ears mouth and genitals (private parts).                       Wash face,  Genitals (private parts) with your normal soap.             6.  Wash thoroughly, paying special attention to the area where your surgery  will be performed.  7.  Thoroughly rinse your body with warm water from the neck down.  8.  DO NOT shower/wash with your normal soap after using and rinsing off  the CHG Soap.                9.  Pat yourself dry with a clean towel.            10.  Wear clean pajamas.            11.  Place clean sheets on your bed the night of your first shower and do not  sleep with pets. Day of Surgery : Do not apply any lotions/deodorants the morning of surgery.  Please wear clean clothes to the hospital/surgery center.  FAILURE TO FOLLOW THESE INSTRUCTIONS MAY RESULT IN THE CANCELLATION OF  YOUR SURGERY PATIENT SIGNATURE_________________________________  NURSE SIGNATURE__________________________________  ________________________________________________________________________   Dorothy Garcia  An incentive spirometer is a tool that can help keep your lungs clear and active. This tool measures how well you are filling your lungs with each breath. Taking long deep breaths may help reverse or decrease the chance of developing breathing (pulmonary) problems (especially infection) following:  A long period of time when you are unable to move or be active. BEFORE THE PROCEDURE   If the spirometer includes an indicator to show your best effort, your nurse or respiratory  therapist will set it to a desired goal.  If possible, sit up straight or lean slightly forward. Try not to slouch.  Hold the incentive spirometer in an upright position. INSTRUCTIONS FOR USE  1. Sit on the edge of your bed if possible, or sit up as far as you can in bed or on a chair. 2. Hold the incentive spirometer in an upright position. 3. Breathe out normally. 4. Place the mouthpiece in your mouth and seal your lips tightly around it. 5. Breathe in slowly and as deeply as possible, raising the piston or the ball toward the top of the column. 6. Hold your breath for 3-5 seconds or for as long as possible. Allow the piston or ball to fall to the bottom of the column. 7. Remove the mouthpiece from your mouth and breathe out normally. 8. Rest for a few seconds and repeat Steps 1 through 7 at least 10 times every 1-2 hours when you are awake. Take your time and take a few normal breaths between deep breaths. 9. The spirometer may include an indicator to show your best effort. Use the indicator as a goal to work toward during each repetition. 10. After each set of 10 deep breaths, practice coughing to be sure your lungs are clear. If you have an incision (the cut made at the time of surgery), support your  incision when coughing by placing a pillow or rolled up towels firmly against it. Once you are able to get out of bed, walk around indoors and cough well. You may stop using the incentive spirometer when instructed by your caregiver.  RISKS AND COMPLICATIONS  Take your time so you do not get dizzy or light-headed.  If you are in pain, you may need to take or ask for pain medication before doing incentive spirometry. It is harder to take a deep breath if you are having pain. AFTER USE  Rest and breathe slowly and easily.  It can be helpful to keep track of a log of your progress. Your caregiver can provide you with a simple table to help with this. If you are using the spirometer at home, follow these instructions: [place] IF:   You are having difficultly using the spirometer.  You have trouble using the spirometer as often as instructed.  Your pain medication is not giving enough relief while using the spirometer.  You develop fever of 100.5 F (38.1 C) or higher. SEEK IMMEDIATE MEDICAL CARE IF:   You cough up bloody sputum that had not been present before.  You develop fever of 102 F (38.9 C) or greater.  You develop worsening pain at or near the incision site. MAKE SURE YOU:   Understand these instructions.  Will watch your condition.  Will get help right away if you are not doing well or get worse. Document Released: 07/24/2006 Document Revised: 06/05/2011 Document Reviewed: 09/24/2006 [facility] Patient Information 2014 [place], [place].   ________________________________________________________________________

## 2019-11-10 ENCOUNTER — Encounter (HOSPITAL_COMMUNITY)
Admission: RE | Admit: 2019-11-10 | Discharge: 2019-11-10 | Disposition: A | Discharge status: Home / Self Care | Payer: PPO | Source: Ambulatory Visit | Attending: Orthopedic Surgery | Admitting: Orthopedic Surgery

## 2019-11-10 ENCOUNTER — Other Ambulatory Visit: Payer: Self-pay

## 2019-11-10 ENCOUNTER — Encounter (HOSPITAL_COMMUNITY): Payer: Self-pay

## 2019-11-10 DIAGNOSIS — Z01812 Encounter for preprocedural laboratory examination: Secondary | ICD-10-CM | POA: Diagnosis not present

## 2019-11-10 HISTORY — DX: Anxiety disorder, unspecified: F41.9

## 2019-11-10 HISTORY — DX: Other complications of anesthesia, initial encounter: T88.59XA

## 2019-11-10 HISTORY — DX: Unspecified osteoarthritis, unspecified site: M19.90

## 2019-11-10 LAB — CBC
HCT: 45.1 % (ref 36.0–46.0)
Hemoglobin: 15 g/dL (ref 12.0–15.0)
MCH: 29.5 pg (ref 26.0–34.0)
MCHC: 33.3 g/dL (ref 30.0–36.0)
MCV: 88.6 fL (ref 80.0–100.0)
Platelets: 317 10*3/uL (ref 150–400)
RBC: 5.09 MIL/uL (ref 3.87–5.11)
RDW: 13.2 % (ref 11.5–15.5)
WBC: 9.3 10*3/uL (ref 4.0–10.5)
nRBC: 0 % (ref 0.0–0.2)

## 2019-11-10 LAB — SURGICAL PCR SCREEN
MRSA, PCR: NEGATIVE
Staphylococcus aureus: NEGATIVE

## 2019-11-10 NOTE — Progress Notes (Signed)
Pt. Received [place] instructions papers.She verbalized her understanding of the subject.

## 2019-11-10 NOTE — Progress Notes (Signed)
COVID Vaccine Completed:Yes Date COVID Vaccine completed:08/18/19 COVID vaccine manufacturer: *[employer]    [employer]   PCP - Dr. Shirline Frees. LOV: 10/2019 Cardiologist - NO  Chest x-ray -  EKG -  Stress Test -  ECHO -  Cardiac Cath -   Sleep Study -  CPAP -   Fasting Blood Sugar -  Checks Blood Sugar _____ times a day  Blood Thinner Instructions: Aspirin Instructions: Last Dose:  Anesthesia review:   Patient denies shortness of breath, fever, cough and chest pain at PAT appointment   Patient verbalized understanding of instructions that were given to them at the PAT appointment. Patient was also instructed that they will need to review over the PAT instructions again at home before surgery.

## 2019-11-14 ENCOUNTER — Other Ambulatory Visit (HOSPITAL_COMMUNITY)
Admission: RE | Admit: 2019-11-14 | Discharge: 2019-11-14 | Disposition: A | Discharge status: Home / Self Care | Payer: PPO | Source: Ambulatory Visit | Attending: Orthopedic Surgery | Admitting: Orthopedic Surgery

## 2019-11-14 DIAGNOSIS — Z20822 Contact with and (suspected) exposure to covid-19: Secondary | ICD-10-CM | POA: Insufficient documentation

## 2019-11-14 DIAGNOSIS — Z01812 Encounter for preprocedural laboratory examination: Secondary | ICD-10-CM | POA: Insufficient documentation

## 2019-11-14 LAB — SARS CORONAVIRUS 2 (TAT 6-24 HRS): SARS Coronavirus 2: NEGATIVE

## 2019-11-17 NOTE — Anesthesia Preprocedure Evaluation (Addendum)
Anesthesia Evaluation  Patient identified by MRN, date of birth, ID band Patient awake    Reviewed: Allergy & Precautions, NPO status , Patient's Chart, lab work & pertinent test results  History of Anesthesia Complications Negative for: history of anesthetic complications  Airway Mallampati: II  TM Distance: >3 FB Neck ROM: Full    Dental  (+) Dental Advisory Given, Teeth Intact   Pulmonary neg pulmonary ROS,    Pulmonary exam normal        Cardiovascular negative cardio ROS Normal cardiovascular exam     Neuro/Psych  Headaches, PSYCHIATRIC DISORDERS Anxiety    GI/Hepatic Neg liver ROS, GERD  Medicated and Controlled,  Endo/Other   Obesity   Renal/GU negative Renal ROS     Musculoskeletal  (+) Arthritis ,   Abdominal   Peds  Hematology negative hematology ROS (+)  Plt 317k    Anesthesia Other Findings Covid test negative   Reproductive/Obstetrics                            Anesthesia Physical Anesthesia Plan  ASA: II  Anesthesia Plan: Spinal   Post-op Pain Management:  Regional for Post-op pain   Induction:   PONV Risk Score and Plan: 2 and Treatment may vary due to age or medical condition and Propofol infusion  Airway Management Planned: Natural Airway and Simple Face Mask  Additional Equipment: None  Intra-op Plan:   Post-operative Plan:   Informed Consent: I have reviewed the patients History and Physical, chart, labs and discussed the procedure including the risks, benefits and alternatives for the proposed anesthesia with the patient or authorized representative who has indicated his/her understanding and acceptance.       Plan Discussed with: CRNA and Anesthesiologist  Anesthesia Plan Comments: (Labs reviewed, platelets acceptable. Discussed risks and benefits of spinal, including spinal/epidural hematoma, infection, failed block, and PDPH. Patient expressed  understanding and wished to proceed. )       Anesthesia Quick Evaluation

## 2019-11-18 ENCOUNTER — Ambulatory Visit (HOSPITAL_COMMUNITY)
Admission: RE | Admit: 2019-11-18 | Discharge: 2019-11-18 | Disposition: A | Discharge status: Home / Self Care | Payer: PPO | Attending: Orthopedic Surgery | Admitting: Orthopedic Surgery

## 2019-11-18 ENCOUNTER — Ambulatory Visit (HOSPITAL_COMMUNITY): Payer: PPO | Admitting: Anesthesiology

## 2019-11-18 ENCOUNTER — Encounter (HOSPITAL_COMMUNITY)
Admission: RE | Disposition: A | Discharge status: Home / Self Care | Payer: Self-pay | Source: Home / Self Care | Attending: Orthopedic Surgery

## 2019-11-18 ENCOUNTER — Encounter (HOSPITAL_COMMUNITY): Payer: Self-pay | Admitting: Orthopedic Surgery

## 2019-11-18 ENCOUNTER — Other Ambulatory Visit: Payer: Self-pay

## 2019-11-18 DIAGNOSIS — Z79890 Hormone replacement therapy (postmenopausal): Secondary | ICD-10-CM | POA: Diagnosis not present

## 2019-11-18 DIAGNOSIS — Z6834 Body mass index (BMI) 34.0-34.9, adult: Secondary | ICD-10-CM | POA: Insufficient documentation

## 2019-11-18 DIAGNOSIS — K219 Gastro-esophageal reflux disease without esophagitis: Secondary | ICD-10-CM | POA: Diagnosis not present

## 2019-11-18 DIAGNOSIS — F419 Anxiety disorder, unspecified: Secondary | ICD-10-CM | POA: Insufficient documentation

## 2019-11-18 DIAGNOSIS — Z79899 Other long term (current) drug therapy: Secondary | ICD-10-CM | POA: Diagnosis not present

## 2019-11-18 DIAGNOSIS — M1711 Unilateral primary osteoarthritis, right knee: Secondary | ICD-10-CM | POA: Diagnosis not present

## 2019-11-18 DIAGNOSIS — E669 Obesity, unspecified: Secondary | ICD-10-CM | POA: Insufficient documentation

## 2019-11-18 DIAGNOSIS — G8918 Other acute postprocedural pain: Secondary | ICD-10-CM | POA: Diagnosis not present

## 2019-11-18 HISTORY — PX: TOTAL KNEE ARTHROPLASTY: SHX125

## 2019-11-18 LAB — TYPE AND SCREEN
ABO/RH(D): A POS
Antibody Screen: NEGATIVE

## 2019-11-18 LAB — ABO/RH: ABO/RH(D): A POS

## 2019-11-18 SURGERY — ARTHROPLASTY, KNEE, TOTAL
Anesthesia: Spinal | Site: Knee | Laterality: Right

## 2019-11-18 MED ORDER — LACTATED RINGERS IV SOLN: INTRAVENOUS | Status: DC

## 2019-11-18 MED ORDER — BUPIVACAINE-EPINEPHRINE (PF) 0.25% -1:200000 IJ SOLN
INTRAMUSCULAR | Status: AC
  Filled 2019-11-18: qty 30

## 2019-11-18 MED ORDER — OXYCODONE HCL 5 MG PO TABS
ORAL_TABLET | ORAL | Status: AC
  Filled 2019-11-18: qty 1

## 2019-11-18 MED ORDER — BUPIVACAINE-EPINEPHRINE (PF) 0.25% -1:200000 IJ SOLN
INTRAMUSCULAR | Status: DC | PRN
  Administered 2019-11-18: 30 mL

## 2019-11-18 MED ORDER — ORAL CARE MOUTH RINSE: 15.0000 mL | Freq: Once | OROMUCOSAL | Status: AC

## 2019-11-18 MED ORDER — OXYCODONE HCL 5 MG PO TABS: 5.0000 mg | ORAL_TABLET | Freq: Once | ORAL | Status: DC | PRN

## 2019-11-18 MED ORDER — LACTATED RINGERS IV BOLUS
500.0000 mL | Freq: Once | INTRAVENOUS | Status: AC
  Administered 2019-11-18: 500 mL via INTRAVENOUS

## 2019-11-18 MED ORDER — METHOCARBAMOL 500 MG PO TABS
ORAL_TABLET | ORAL | Status: AC
  Filled 2019-11-18: qty 1

## 2019-11-18 MED ORDER — PHENYLEPHRINE 40 MCG/ML (10ML) SYRINGE FOR IV PUSH (FOR BLOOD PRESSURE SUPPORT)
PREFILLED_SYRINGE | INTRAVENOUS | Status: DC | PRN
  Administered 2019-11-18 (×2): 80 ug via INTRAVENOUS
  Administered 2019-11-18: 40 ug via INTRAVENOUS
  Administered 2019-11-18 (×2): 80 ug via INTRAVENOUS

## 2019-11-18 MED ORDER — 0.9 % SODIUM CHLORIDE (POUR BTL) OPTIME
TOPICAL | Status: DC | PRN
  Administered 2019-11-18: 1000 mL

## 2019-11-18 MED ORDER — PROPOFOL 500 MG/50ML IV EMUL
INTRAVENOUS | Status: DC | PRN
  Administered 2019-11-18: 100 ug/kg/min via INTRAVENOUS

## 2019-11-18 MED ORDER — EPHEDRINE SULFATE-NACL 50-0.9 MG/10ML-% IV SOSY
PREFILLED_SYRINGE | INTRAVENOUS | Status: DC | PRN
  Administered 2019-11-18: 10 mg via INTRAVENOUS
  Administered 2019-11-18 (×2): 5 mg via INTRAVENOUS

## 2019-11-18 MED ORDER — MIDAZOLAM HCL 2 MG/2ML IJ SOLN
INTRAMUSCULAR | Status: AC
  Filled 2019-11-18: qty 2

## 2019-11-18 MED ORDER — DEXAMETHASONE SODIUM PHOSPHATE 10 MG/ML IJ SOLN
10.0000 mg | Freq: Once | INTRAMUSCULAR | Status: AC
  Administered 2019-11-18: 10 mg via INTRAVENOUS

## 2019-11-18 MED ORDER — STERILE WATER FOR IRRIGATION IR SOLN
Status: DC | PRN
  Administered 2019-11-18: 2000 mL

## 2019-11-18 MED ORDER — METHOCARBAMOL 500 MG IVPB - SIMPLE MED: 500.0000 mg | Freq: Four times a day (QID) | INTRAVENOUS | Status: DC | PRN

## 2019-11-18 MED ORDER — FENTANYL CITRATE (PF) 100 MCG/2ML IJ SOLN
INTRAMUSCULAR | Status: AC
  Filled 2019-11-18: qty 2

## 2019-11-18 MED ORDER — HYDROMORPHONE HCL 2 MG PO TABS
ORAL_TABLET | ORAL | Status: AC
  Filled 2019-11-18: qty 1

## 2019-11-18 MED ORDER — ROPIVACAINE HCL 7.5 MG/ML IJ SOLN
INTRAMUSCULAR | Status: DC | PRN
  Administered 2019-11-18: 20 mL via PERINEURAL

## 2019-11-18 MED ORDER — PROPOFOL 500 MG/50ML IV EMUL
INTRAVENOUS | Status: AC
  Filled 2019-11-18: qty 100

## 2019-11-18 MED ORDER — PHENYLEPHRINE 40 MCG/ML (10ML) SYRINGE FOR IV PUSH (FOR BLOOD PRESSURE SUPPORT)
PREFILLED_SYRINGE | INTRAVENOUS | Status: AC
  Filled 2019-11-18: qty 10

## 2019-11-18 MED ORDER — POVIDONE-IODINE 10 % EX SWAB: 2.0000 "application " | Freq: Once | CUTANEOUS | Status: DC

## 2019-11-18 MED ORDER — CEFAZOLIN SODIUM-DEXTROSE 2-4 GM/100ML-% IV SOLN
2.0000 g | INTRAVENOUS | Status: AC
  Administered 2019-11-18: 2 g via INTRAVENOUS

## 2019-11-18 MED ORDER — ONDANSETRON HCL 4 MG/2ML IJ SOLN: 4.0000 mg | Freq: Once | INTRAMUSCULAR | Status: DC | PRN

## 2019-11-18 MED ORDER — SODIUM CHLORIDE 0.9 % IR SOLN
Status: DC | PRN
  Administered 2019-11-18: 1000 mL

## 2019-11-18 MED ORDER — LACTATED RINGERS IV BOLUS
250.0000 mL | Freq: Once | INTRAVENOUS | Status: AC
  Administered 2019-11-18: 250 mL via INTRAVENOUS

## 2019-11-18 MED ORDER — CEFAZOLIN SODIUM-DEXTROSE 2-4 GM/100ML-% IV SOLN
INTRAVENOUS | Status: AC
  Filled 2019-11-18: qty 100

## 2019-11-18 MED ORDER — FENTANYL CITRATE (PF) 100 MCG/2ML IJ SOLN: 25.0000 ug | INTRAMUSCULAR | Status: DC | PRN

## 2019-11-18 MED ORDER — METHOCARBAMOL 500 MG PO TABS: 500.0000 mg | ORAL_TABLET | Freq: Four times a day (QID) | ORAL | Status: DC | PRN

## 2019-11-18 MED ORDER — EPHEDRINE 5 MG/ML INJ
INTRAVENOUS | Status: AC
  Filled 2019-11-18: qty 10

## 2019-11-18 MED ORDER — ONDANSETRON HCL 4 MG/2ML IJ SOLN
INTRAMUSCULAR | Status: AC
  Filled 2019-11-18: qty 2

## 2019-11-18 MED ORDER — SODIUM CHLORIDE (PF) 0.9 % IJ SOLN
INTRAMUSCULAR | Status: AC
  Filled 2019-11-18: qty 50

## 2019-11-18 MED ORDER — TRANEXAMIC ACID-NACL 1000-0.7 MG/100ML-% IV SOLN
1000.0000 mg | Freq: Once | INTRAVENOUS | Status: AC
  Administered 2019-11-18: 1000 mg via INTRAVENOUS

## 2019-11-18 MED ORDER — MIDAZOLAM HCL 5 MG/5ML IJ SOLN
INTRAMUSCULAR | Status: DC | PRN
  Administered 2019-11-18 (×2): 2 mg via INTRAVENOUS

## 2019-11-18 MED ORDER — KETOROLAC TROMETHAMINE 30 MG/ML IJ SOLN
INTRAMUSCULAR | Status: DC | PRN
  Administered 2019-11-18: 30 mg

## 2019-11-18 MED ORDER — ONDANSETRON HCL 4 MG/2ML IJ SOLN: 4.0000 mg | Freq: Four times a day (QID) | INTRAMUSCULAR | Status: DC | PRN

## 2019-11-18 MED ORDER — FENTANYL CITRATE (PF) 250 MCG/5ML IJ SOLN
INTRAMUSCULAR | Status: DC | PRN
Start: 2019-11-18 — End: 2019-11-18
  Administered 2019-11-18 (×4): 50 ug via INTRAVENOUS

## 2019-11-18 MED ORDER — CHLORHEXIDINE GLUCONATE 0.12 % MT SOLN
15.0000 mL | Freq: Once | OROMUCOSAL | Status: AC
  Administered 2019-11-18: 15 mL via OROMUCOSAL

## 2019-11-18 MED ORDER — TRANEXAMIC ACID-NACL 1000-0.7 MG/100ML-% IV SOLN
INTRAVENOUS | Status: AC
  Filled 2019-11-18: qty 100

## 2019-11-18 MED ORDER — ONDANSETRON HCL 4 MG/2ML IJ SOLN
INTRAMUSCULAR | Status: DC | PRN
  Administered 2019-11-18: 4 mg via INTRAVENOUS

## 2019-11-18 MED ORDER — ONDANSETRON HCL 4 MG PO TABS
4.0000 mg | ORAL_TABLET | Freq: Four times a day (QID) | ORAL | Status: DC | PRN
  Filled 2019-11-18: qty 1

## 2019-11-18 MED ORDER — METOCLOPRAMIDE HCL 5 MG/ML IJ SOLN: 5.0000 mg | Freq: Three times a day (TID) | INTRAMUSCULAR | Status: DC | PRN

## 2019-11-18 MED ORDER — TRANEXAMIC ACID-NACL 1000-0.7 MG/100ML-% IV SOLN
1000.0000 mg | INTRAVENOUS | Status: AC
  Administered 2019-11-18: 1000 mg via INTRAVENOUS

## 2019-11-18 MED ORDER — OXYCODONE HCL 5 MG/5ML PO SOLN: 5.0000 mg | Freq: Once | ORAL | Status: DC | PRN

## 2019-11-18 MED ORDER — METOCLOPRAMIDE HCL 5 MG PO TABS
5.0000 mg | ORAL_TABLET | Freq: Three times a day (TID) | ORAL | Status: DC | PRN
  Filled 2019-11-18: qty 2

## 2019-11-18 MED ORDER — SODIUM CHLORIDE (PF) 0.9 % IJ SOLN
INTRAMUSCULAR | Status: DC | PRN
  Administered 2019-11-18: 30 mL

## 2019-11-18 MED ORDER — KETOROLAC TROMETHAMINE 30 MG/ML IJ SOLN
INTRAMUSCULAR | Status: AC
  Filled 2019-11-18: qty 1

## 2019-11-18 MED ORDER — DEXAMETHASONE SODIUM PHOSPHATE 10 MG/ML IJ SOLN
INTRAMUSCULAR | Status: AC
  Filled 2019-11-18: qty 1

## 2019-11-18 MED ORDER — HYDROMORPHONE HCL 2 MG PO TABS
2.0000 mg | ORAL_TABLET | ORAL | Status: DC | PRN
  Administered 2019-11-18: 2 mg via ORAL

## 2019-11-18 MED ORDER — MEPIVACAINE HCL (PF) 2 % IJ SOLN
INTRAMUSCULAR | Status: DC | PRN
  Administered 2019-11-18: 3 mL via INTRATHECAL

## 2019-11-18 MED ORDER — PROPOFOL 10 MG/ML IV BOLUS
INTRAVENOUS | Status: AC
  Filled 2019-11-18: qty 20

## 2019-11-18 SURGICAL SUPPLY — 62 items
ADH SKN CLS APL DERMABOND .7 (GAUZE/BANDAGES/DRESSINGS) ×1
ATTUNE MED ANAT PAT 32 KNEE (Knees) ×2 IMPLANT
ATTUNE MED ANAT PAT 32MM KNEE (Knees) ×1 IMPLANT
ATTUNE PSFEM RTSZ4 NARCEM KNEE (Femur) ×3 IMPLANT
ATTUNE PSRP INSR SZ4 7 KNEE (Insert) ×2 IMPLANT
ATTUNE PSRP INSR SZ4 7MM KNEE (Insert) ×1 IMPLANT
BAG SPEC THK2 15X12 ZIP CLS (MISCELLANEOUS)
BAG ZIPLOCK 12X15 (MISCELLANEOUS) IMPLANT
BASE TIBIAL ROT PLAT SZ 3 KNEE (Knees) ×1 IMPLANT
BLADE SAW SGTL 11.0X1.19X90.0M (BLADE) IMPLANT
BLADE SAW SGTL 13.0X1.19X90.0M (BLADE) ×3 IMPLANT
BLADE SURG SZ10 CARB STEEL (BLADE) ×6 IMPLANT
BNDG ELASTIC 6X5.8 VLCR STR LF (GAUZE/BANDAGES/DRESSINGS) ×3 IMPLANT
BOWL SMART MIX CTS (DISPOSABLE) ×3 IMPLANT
BSPLAT TIB 3 CMNT ROT PLAT STR (Knees) ×1 IMPLANT
CEMENT HV SMART SET (Cement) ×6 IMPLANT
COVER SURGICAL LIGHT HANDLE (MISCELLANEOUS) ×3 IMPLANT
COVER WAND RF STERILE (DRAPES) IMPLANT
CUFF TOURN SGL QUICK 34 (TOURNIQUET CUFF) ×3
CUFF TRNQT CYL 34X4.125X (TOURNIQUET CUFF) ×1 IMPLANT
DECANTER SPIKE VIAL GLASS SM (MISCELLANEOUS) ×6 IMPLANT
DERMABOND ADVANCED (GAUZE/BANDAGES/DRESSINGS) ×2
DERMABOND ADVANCED .7 DNX12 (GAUZE/BANDAGES/DRESSINGS) ×1 IMPLANT
DRAPE U-SHAPE 47X51 STRL (DRAPES) ×3 IMPLANT
DRESSING AQUACEL AG SP 3.5X10 (GAUZE/BANDAGES/DRESSINGS) ×1 IMPLANT
DRSG AQUACEL AG ADV 3.5X10 (GAUZE/BANDAGES/DRESSINGS) ×3 IMPLANT
DRSG AQUACEL AG SP 3.5X10 (GAUZE/BANDAGES/DRESSINGS) ×3
DURAPREP 26ML APPLICATOR (WOUND CARE) ×6 IMPLANT
ELECT REM PT RETURN 15FT ADLT (MISCELLANEOUS) ×3 IMPLANT
GLOVE BIOGEL PI IND STRL 6.5 (GLOVE) ×1 IMPLANT
GLOVE BIOGEL PI IND STRL 7.5 (GLOVE) ×1 IMPLANT
GLOVE BIOGEL PI IND STRL 8.5 (GLOVE) ×1 IMPLANT
GLOVE BIOGEL PI INDICATOR 6.5 (GLOVE) ×2
GLOVE BIOGEL PI INDICATOR 7.5 (GLOVE) ×2
GLOVE BIOGEL PI INDICATOR 8.5 (GLOVE) ×2
GOWN STRL REUS W/ TWL LRG LVL3 (GOWN DISPOSABLE) ×1 IMPLANT
GOWN STRL REUS W/TWL LRG LVL3 (GOWN DISPOSABLE) ×6 IMPLANT
HANDPIECE INTERPULSE COAX TIP (DISPOSABLE) ×3
HOLDER FOLEY CATH W/STRAP (MISCELLANEOUS) IMPLANT
KIT TURNOVER KIT A (KITS) IMPLANT
MANIFOLD NEPTUNE II (INSTRUMENTS) ×3 IMPLANT
NDL SAFETY ECLIPSE 18X1.5 (NEEDLE) IMPLANT
NEEDLE HYPO 18GX1.5 SHARP (NEEDLE)
NS IRRIG 1000ML POUR BTL (IV SOLUTION) ×3 IMPLANT
PACK TOTAL KNEE CUSTOM (KITS) ×3 IMPLANT
PENCIL SMOKE EVACUATOR (MISCELLANEOUS) IMPLANT
PIN DRILL FIX HALF THREAD (BIT) ×3 IMPLANT
PIN FIX SIGMA LCS THRD HI (PIN) ×3 IMPLANT
PROTECTOR NERVE ULNAR (MISCELLANEOUS) ×3 IMPLANT
SET HNDPC FAN SPRY TIP SCT (DISPOSABLE) ×1 IMPLANT
SET PAD KNEE POSITIONER (MISCELLANEOUS) ×3 IMPLANT
SUT MNCRL AB 4-0 PS2 18 (SUTURE) ×3 IMPLANT
SUT STRATAFIX PDS+ 0 24IN (SUTURE) ×3 IMPLANT
SUT VIC AB 1 CT1 36 (SUTURE) ×3 IMPLANT
SUT VIC AB 2-0 CT1 27 (SUTURE) ×9
SUT VIC AB 2-0 CT1 TAPERPNT 27 (SUTURE) ×3 IMPLANT
SYR 3ML LL SCALE MARK (SYRINGE) ×3 IMPLANT
TIBIAL BASE ROT PLAT SZ 3 KNEE (Knees) ×3 IMPLANT
TRAY FOL W/BAG SLVR 16FR STRL (SET/KITS/TRAYS/PACK) ×1 IMPLANT
TRAY FOLEY W/BAG SLVR 16FR LF (SET/KITS/TRAYS/PACK) ×3
WATER STERILE IRR 1000ML POUR (IV SOLUTION) ×6 IMPLANT
WRAP KNEE MAXI GEL POST OP (GAUZE/BANDAGES/DRESSINGS) ×3 IMPLANT

## 2019-11-18 NOTE — Discharge Instructions (Signed)

## 2019-11-18 NOTE — Anesthesia Procedure Notes (Signed)
Spinal  Patient location during procedure: OR Start time: 11/18/2019 7:37 AM End time: 11/18/2019 7:42 AM Staffing Performed: resident/CRNA  Resident/CRNA: Lieutenant Diego, CRNA Preanesthetic Checklist Completed: patient identified, IV checked, site marked, risks and benefits discussed, surgical consent, monitors and equipment checked, pre-op evaluation and timeout performed Spinal Block Patient position: sitting Prep: DuraPrep Patient monitoring: heart rate, continuous pulse ox and blood pressure Approach: midline Location: L3-4 Injection technique: single-shot Needle Needle type: Pencan  Needle gauge: 24 G Needle length: 10 cm Assessment Sensory level: T4 Additional Notes Sitting position. Monitors on. Single stick, CSF obtained, slowly injected, no heme. Pt tol well.

## 2019-11-18 NOTE — Op Note (Signed)
NAME:  Dorothy Garcia                      MEDICAL RECORD NO.:  [id number]                             FACILITY:  [facility]      PHYSICIAN:  Pietro Cassis. Alvan Dame, M.D.  DATE OF BIRTH:  1955/12/11      DATE OF PROCEDURE:  11/18/2019                                     OPERATIVE REPORT         PREOPERATIVE DIAGNOSIS:  Right knee osteoarthritis.      POSTOPERATIVE DIAGNOSIS:  Right knee osteoarthritis.      FINDINGS:  The patient was noted to have complete loss of cartilage and   bone-on-bone arthritis with associated osteophytes in the medial and patellofemoral compartments of   the knee with a significant synovitis and associated effusion.  The patient had failed months of conservative treatment including medications, injection therapy, activity modification.     PROCEDURE:  Right total knee replacement.      COMPONENTS USED:  DePuy Attune rotating platform posterior stabilized knee   system, a size 4N femur, 3 tibia, size 7 mm PS AOX insert, and 32 anatomic patellar   button.      SURGEON:  Pietro Cassis. Alvan Dame, M.D.      ASSISTANT:  Griffith Citron, PA-C.      ANESTHESIA:  Regional and Spinal.      SPECIMENS:  None.      COMPLICATION:  None.      DRAINS:  None.  EBL: <100 cc      TOURNIQUET TIME:   Total Tourniquet Time Documented: Thigh (Right) - 27 minutes Total: Thigh (Right) - 27 minutes  .      The patient was stable to the recovery room.      INDICATION FOR PROCEDURE:  Dorothy Garcia is a 64 y.o. female patient of   mine.  The patient had been seen, evaluated, and treated for months conservatively in the   office with medication, activity modification, and injections.  The patient had   radiographic changes of bone-on-bone arthritis with endplate sclerosis and osteophytes noted.  Based on the radiographic changes and failed conservative measures, the patient   decided to proceed with definitive treatment, total knee replacement.  Risks of infection, DVT, component  failure, need for revision surgery, neurovascular injury were reviewed in the office setting.  The postop course was reviewed stressing the efforts to maximize post-operative satisfaction and function.  Consent was obtained for benefit of pain   relief.      PROCEDURE IN DETAIL:  The patient was brought to the operative theater.   Once adequate anesthesia, preoperative antibiotics, 2 gm of Ancef,1 gm of Tranexamic Acid, and 10 mg of Decadron administered, the patient was positioned supine with a right thigh tourniquet placed.  The  right lower extremity was prepped and draped in sterile fashion.  A time-   out was performed identifying the patient, planned procedure, and the appropriate extremity.      The right lower extremity was placed in the [facility] leg holder.  The leg was   exsanguinated, tourniquet elevated to 250 mmHg.  A midline incision was  made followed by median parapatellar arthrotomy.  Following initial   exposure, attention was first directed to the patella.  Precut   measurement was noted to be 21 mm.  I resected down to 13 mm and used a   32 anatomic patellar button to restore patellar height as well as cover the cut surface.      The lug holes were drilled and a metal shim was placed to protect the   patella from retractors and saw blade during the procedure.      At this point, attention was now directed to the femur.  The femoral   canal was opened with a drill, irrigated to try to prevent fat emboli.  An   intramedullary rod was passed at 3 degrees valgus, 9 mm of bone was   resected off the distal femur.  Following this resection, the tibia was   subluxated anteriorly.  Using the extramedullary guide, 2 mm of bone was resected off   the proximal medail tibia.  We confirmed the gap would be   stable medially and laterally with a size 5 spacer block as well as confirmed that the tibial cut was perpendicular in the coronal plane, checking with an alignment rod.      Once  this was done, I sized the femur to be a size 4 in the anterior-   posterior dimension, chose a narrow component based on medial and   lateral dimension.  The size 4 rotation block was then pinned in   position anterior referenced using the C-clamp to set rotation.  The   anterior, posterior, and  chamfer cuts were made without difficulty nor   notching making certain that I was along the anterior cortex to help   with flexion gap stability.      The final box cut was made off the lateral aspect of distal femur.      At this point, the tibia was sized to be a size 3.  The size 3 tray was   then pinned in position through the medial third of the tubercle,   drilled, and keel punched.  Trial reduction was now carried with a 4 femur,  3 tibia, a size 7 mm PS insert, and the 32 anatomic patella botton.  The knee was brought to full extension with good flexion stability with the patella   tracking through the trochlea without application of pressure.  Given   all these findings the trial components removed.  Final components were   opened and cement was mixed.  The knee was irrigated with normal saline solution and pulse lavage.  The synovial lining was   then injected with 30 cc of 0.25% Marcaine with epinephrine, 1 cc of Toradol and 30 cc of NS for a total of 61 cc.     Final implants were then cemented onto cleaned and dried cut surfaces of bone with the knee brought to extension with a size 7 mm PS trial insert.      Once the cement had fully cured, excess cement was removed   throughout the knee.  I confirmed that I was satisfied with the range of   motion and stability, and the final size 7 mm PS AOX insert was chosen.  It was   placed into the knee.      The tourniquet had been let down at 26 minutes.  No significant   hemostasis was required.  The extensor mechanism was then reapproximated using #1 Vicryl  and #1 Stratafix sutures with the knee   in flexion.  The   remaining wound was  closed with 2-0 Vicryl and running 4-0 Monocryl.   The knee was cleaned, dried, dressed sterilely using Dermabond and   Aquacel dressing.  The patient was then   brought to recovery room in stable condition, tolerating the procedure   well.   Please note that Physician Assistant, Griffith Citron, PA-C was present for the entirety of the case, and was utilized for pre-operative positioning, peri-operative retractor management, general facilitation of the procedure and for primary wound closure at the end of the case.              Pietro Cassis Alvan Dame, M.D.    11/18/2019 8:54 AM

## 2019-11-18 NOTE — Evaluation (Signed)
Physical Therapy Evaluation Patient Details Name: Dorothy Garcia MRN: [id number] DOB: [birth year] Today's Date: 11/18/2019   History of Present Illness  Patient is 64 y.o. female s/p Rt TKA on 11/18/19 with PMH significant for OA, anxiety, Lt TKA.     Clinical Impression  Dorothy Garcia is a 64 y.o. female POD 0 s/p Rt TKA. Patient reports independence with mobility at baseline. Patient is now limited by functional impairments (see PT problem list below) and requires min guard/supervision for transfers and gait with RW. Patient was able to ambulate ~120 feet with RW and min guard and cues for safe walker management. Patient educated on safe sequencing for stair mobility and verbalized safe guarding position for people assisting with mobility. Patient instructed in exercises to facilitate ROM and circulation. Patient will benefit from continued skilled PT interventions to address impairments and progress towards PLOF. Patient has met mobility goals at adequate level for discharge home; will continue to follow if pt continues acute stay to progress towards Mod I goals.     Follow Up Recommendations Follow surgeon's recommendation for DC plan and follow-up therapies;Outpatient PT    Equipment Recommendations  None recommended by PT    Recommendations for Other Services       Precautions / Restrictions Precautions Precautions: Fall Restrictions Weight Bearing Restrictions: No Other Position/Activity Restrictions: WBAT      Mobility  Bed Mobility Overal bed mobility: Needs Assistance Bed Mobility: Supine to Sit     Supine to sit: Supervision     General bed mobility comments: no assist required, pt taking some extra time.   Transfers Overall transfer level: Needs assistance Equipment used: Rolling walker (2 wheeled) Transfers: Sit to/from Stand Sit to Stand: Min guard;Supervision         General transfer comment: cues for technique with RW. no assist needed for power up  and pt steady in standing.  Ambulation/Gait Ambulation/Gait assistance: Min guard;Supervision Gait Distance (Feet): 120 Feet Assistive device: Rolling walker (2 wheeled) Gait Pattern/deviations: Step-to pattern;Decreased stride length Gait velocity: decr   General Gait Details: cues for safe step pattern and proximity. pt required intermittent cues, no overt LOB noted and no buckling at Rt knee.  Stairs Stairs: Yes Stairs assistance: Min guard Stair Management: One rail Left;Forwards;With cane;Step to pattern Number of Stairs: 2 General stair comments: cues for step sequencing "up with good, down with bad" and sequencing cane placement. No overt LOB noted. Pt able to verablize safe guarding position for her husband.  Wheelchair Mobility    Modified Rankin (Stroke Patients Only)       Balance Overall balance assessment: Needs assistance Sitting-balance support: Feet supported Sitting balance-Leahy Scale: Good     Standing balance support: During functional activity;Bilateral upper extremity supported Standing balance-Leahy Scale: Fair              Pertinent Vitals/Pain Pain Assessment: 0-10 Pain Score: 7  Pain Location: Rt knee Pain Descriptors / Indicators: Aching;Discomfort;Dull;Sore Pain Intervention(s): Limited activity within patient's tolerance;Monitored during session;Repositioned;Ice applied    Home Living Family/patient expects to be discharged to:: Private residence Living Arrangements: Spouse/significant other Available Help at Discharge: Family Type of Home: House Home Access: Stairs to enter Entrance Stairs-Rails: Left Entrance Stairs-Number of Steps: 2 Home Layout: One level Home Equipment: Mobridge - 2 wheels;Cane - single point      Prior Function Level of Independence: Independent               Hand Dominance   Dominant Hand:  Right    Extremity/Trunk Assessment   Upper Extremity Assessment Upper Extremity Assessment: Overall WFL  for tasks assessed    Lower Extremity Assessment Lower Extremity Assessment: RLE deficits/detail RLE Deficits / Details: good quad activation, no extensor lag with SLR RLE Sensation: WNL RLE Coordination: WNL    Cervical / Trunk Assessment Cervical / Trunk Assessment: Normal  Communication   Communication: No difficulties  Cognition Arousal/Alertness: Awake/alert Behavior During Therapy: WFL for tasks assessed/performed Overall Cognitive Status: Within Functional Limits for tasks assessed                   General Comments      Exercises Total Joint Exercises Ankle Circles/Pumps: AROM;Both;10 reps;Supine Quad Sets: AROM;Right;Other reps (comment);Supine (2) Heel Slides: AROM;Right;Other reps (comment);Supine (2) Hip ABduction/ADduction: AROM;Right;Other reps (comment);Supine (2) Straight Leg Raises: AROM;Right;Other reps (comment);Supine (2)   Assessment/Plan    PT Assessment Patient needs continued PT services  PT Problem List Decreased strength;Decreased range of motion;Decreased activity tolerance;Decreased balance;Decreased mobility;Decreased knowledge of use of DME;Decreased knowledge of precautions;Pain       PT Treatment Interventions DME instruction;Gait training;Stair training;Functional mobility training;Therapeutic activities;Therapeutic exercise;Balance training;Patient/family education    PT Goals (Current goals can be found in the Care Plan section)  Acute Rehab PT Goals Patient Stated Goal: get home and recover PT Goal Formulation: With patient Time For Goal Achievement: 11/25/19 Potential to Achieve Goals: Good    Frequency 7X/week   Barriers to discharge        Co-evaluation               AM-PAC PT "6 Clicks" Mobility  Outcome Measure Help needed turning from your back to your side while in a flat bed without using bedrails?: None Help needed moving from lying on your back to sitting on the side of a flat bed without using  bedrails?: A Little Help needed moving to and from a bed to a chair (including a wheelchair)?: A Little Help needed standing up from a chair using your arms (e.g., wheelchair or bedside chair)?: A Little Help needed to walk in hospital room?: A Little Help needed climbing 3-5 steps with a railing? : A Little 6 Click Score: 19    End of Session Equipment Utilized During Treatment: Gait belt Activity Tolerance: Patient tolerated treatment well Patient left: in bed;with call bell/phone within reach;with family/visitor present Nurse Communication: Mobility status PT Visit Diagnosis: Muscle weakness (generalized) (M62.81);Difficulty in walking, not elsewhere classified (R26.2)    Time: 3810-1751 PT Time Calculation (min) (ACUTE ONLY): 45 min   Charges:   PT Evaluation $PT Eval Low Complexity: 1 Low PT Treatments $Gait Training: 8-22 mins $Therapeutic Exercise: 8-22 mins        Verner Mould, DPT Acute Rehabilitation Services  Office [phone] Pager [phone]  11/18/2019 3:21 PM

## 2019-11-18 NOTE — Transfer of Care (Signed)
Immediate Anesthesia Transfer of Care Note  Patient: Dorothy Garcia  Procedure(s) Performed: TOTAL KNEE ARTHROPLASTY (Right Knee)  Patient Location: PACU  Anesthesia Type:Regional and Spinal  Level of Consciousness: sedated  Airway & Oxygen Therapy: Patient Spontanous Breathing and Patient connected to face mask oxygen  Post-op Assessment: Report given to RN and Post -op Vital signs reviewed and stable  Post vital signs: Reviewed and stable  Last Vitals:  Vitals Value Taken Time  BP 120/58 11/18/19 0915  Temp    Pulse 94 11/18/19 0916  Resp 15 11/18/19 0916  SpO2 99 % 11/18/19 0916  Vitals shown include unvalidated device data.  Last Pain:  Vitals:   11/18/19 0550  TempSrc: Oral      Patients Stated Pain Goal: 4 (01/74/94 4967)  Complications: No complications documented.

## 2019-11-18 NOTE — Anesthesia Procedure Notes (Signed)
Anesthesia Regional Block: Adductor canal block   Pre-Anesthetic Checklist: ,, timeout performed, Correct Patient, Correct Site, Correct Laterality, Correct Procedure, Correct Position, site marked, Risks and benefits discussed,  Surgical consent,  Pre-op evaluation,  At surgeon's request and post-op pain management  Laterality: Right  Prep: chloraprep       Needles:  Injection technique: Single-shot  Needle Type: Echogenic Needle     Needle Length: 10cm  Needle Gauge: 21     Additional Needles:   Narrative:  Start time: 11/18/2019 6:46 AM End time: 11/18/2019 6:50 AM Injection made incrementally with aspirations every 5 mL.  Performed by: Personally  Anesthesiologist: Audry Pili, MD  Additional Notes: No pain on injection. No increased resistance to injection. Injection made in 5cc increments. Good needle visualization. Patient tolerated the procedure well.

## 2019-11-18 NOTE — Anesthesia Procedure Notes (Signed)
Procedure Name: MAC Date/Time: 11/18/2019 8:14 AM Performed by: Lieutenant Diego, CRNA Pre-anesthesia Checklist: Patient identified, Emergency Drugs available, Suction available, Patient being monitored and Timeout performed Patient Re-evaluated:Patient Re-evaluated prior to induction Oxygen Delivery Method: Simple face mask Preoxygenation: Pre-oxygenation with 100% oxygen

## 2019-11-18 NOTE — Anesthesia Postprocedure Evaluation (Signed)
Anesthesia Post Note  Patient: Dorothy Garcia  Procedure(s) Performed: TOTAL KNEE ARTHROPLASTY (Right Knee)     Patient location during evaluation: PACU Anesthesia Type: Spinal Level of consciousness: awake and alert Pain management: pain level controlled Vital Signs Assessment: post-procedure vital signs reviewed and stable Respiratory status: spontaneous breathing and respiratory function stable Cardiovascular status: blood pressure returned to baseline and stable Postop Assessment: spinal receding and no apparent nausea or vomiting Anesthetic complications: no   No complications documented.  Last Vitals:  Vitals:   11/18/19 1045 11/18/19 1100  BP: 120/76 127/78  Pulse: 68 67  Resp: 18 16  Temp:    SpO2: 100% 100%    Last Pain:  Vitals:   11/18/19 1100  TempSrc:   PainSc: 0-No pain    LLE Motor Response: No movement due to regional block (11/18/19 1100)   RLE Motor Response: No movement due to regional block (11/18/19 1100)   L Sensory Level: L4-Anterior knee, lower leg (11/18/19 1100) R Sensory Level: L4-Anterior knee, lower leg (11/18/19 1100)  Audry Pili

## 2019-11-18 NOTE — Interval H&P Note (Signed)
History and Physical Interval Note:  11/18/2019 7:26 AM  Dorothy Garcia  has presented today for surgery, with the diagnosis of Right knee osteoarthritis.  The various methods of treatment have been discussed with the patient and family. After consideration of risks, benefits and other options for treatment, the patient has consented to  Procedure(s) with comments: TOTAL KNEE ARTHROPLASTY (Right) - 70 mins Requests morning surgery as a surgical intervention.  The patient's history has been reviewed, patient examined, no change in status, stable for surgery.  I have reviewed the patient's chart and labs.  Questions were answered to the patient's satisfaction.     Mauri Pole

## 2019-11-19 ENCOUNTER — Encounter (HOSPITAL_COMMUNITY): Payer: Self-pay | Admitting: Orthopedic Surgery

## 2019-11-21 DIAGNOSIS — M25661 Stiffness of right knee, not elsewhere classified: Secondary | ICD-10-CM | POA: Diagnosis not present

## 2019-11-21 DIAGNOSIS — M25561 Pain in right knee: Secondary | ICD-10-CM | POA: Diagnosis not present

## 2019-11-24 DIAGNOSIS — M25561 Pain in right knee: Secondary | ICD-10-CM | POA: Diagnosis not present

## 2019-11-26 DIAGNOSIS — M25561 Pain in right knee: Secondary | ICD-10-CM | POA: Diagnosis not present

## 2019-11-27 ENCOUNTER — Telehealth: Payer: Self-pay | Admitting: *Deleted

## 2019-11-27 NOTE — Telephone Encounter (Signed)
Patient called and informed of potential exposure to employee that tested positive for COVID-19 during recent visit. Patient verbalized understanding. Patient scheduled for testing on 11/28/19 at 9:10 am.

## 2019-11-28 ENCOUNTER — Other Ambulatory Visit (HOSPITAL_COMMUNITY)
Admission: RE | Admit: 2019-11-28 | Discharge: 2019-11-28 | Disposition: A | Discharge status: Home / Self Care | Payer: PPO | Source: Ambulatory Visit | Attending: Internal Medicine | Admitting: Internal Medicine

## 2019-11-28 DIAGNOSIS — Z20822 Contact with and (suspected) exposure to covid-19: Secondary | ICD-10-CM | POA: Insufficient documentation

## 2019-11-28 LAB — SARS CORONAVIRUS 2 BY RT PCR (HOSPITAL ORDER, PERFORMED IN ~~LOC~~ HOSPITAL LAB): SARS Coronavirus 2: NEGATIVE

## 2019-12-03 DIAGNOSIS — M25561 Pain in right knee: Secondary | ICD-10-CM | POA: Diagnosis not present

## 2019-12-05 DIAGNOSIS — M25561 Pain in right knee: Secondary | ICD-10-CM | POA: Diagnosis not present

## 2019-12-05 DIAGNOSIS — M25661 Stiffness of right knee, not elsewhere classified: Secondary | ICD-10-CM | POA: Diagnosis not present

## 2019-12-08 DIAGNOSIS — M25661 Stiffness of right knee, not elsewhere classified: Secondary | ICD-10-CM | POA: Diagnosis not present

## 2019-12-08 DIAGNOSIS — M25561 Pain in right knee: Secondary | ICD-10-CM | POA: Diagnosis not present

## 2019-12-10 DIAGNOSIS — M25661 Stiffness of right knee, not elsewhere classified: Secondary | ICD-10-CM | POA: Diagnosis not present

## 2019-12-10 DIAGNOSIS — M25561 Pain in right knee: Secondary | ICD-10-CM | POA: Diagnosis not present

## 2019-12-12 DIAGNOSIS — M25561 Pain in right knee: Secondary | ICD-10-CM | POA: Diagnosis not present

## 2019-12-12 DIAGNOSIS — M25661 Stiffness of right knee, not elsewhere classified: Secondary | ICD-10-CM | POA: Diagnosis not present

## 2019-12-16 DIAGNOSIS — M25561 Pain in right knee: Secondary | ICD-10-CM | POA: Diagnosis not present

## 2019-12-16 DIAGNOSIS — M25661 Stiffness of right knee, not elsewhere classified: Secondary | ICD-10-CM | POA: Diagnosis not present

## 2019-12-18 DIAGNOSIS — M25661 Stiffness of right knee, not elsewhere classified: Secondary | ICD-10-CM | POA: Diagnosis not present

## 2019-12-18 DIAGNOSIS — M25561 Pain in right knee: Secondary | ICD-10-CM | POA: Diagnosis not present

## 2019-12-22 DIAGNOSIS — R5383 Other fatigue: Secondary | ICD-10-CM | POA: Diagnosis not present

## 2019-12-22 DIAGNOSIS — B349 Viral infection, unspecified: Secondary | ICD-10-CM | POA: Diagnosis not present

## 2019-12-22 DIAGNOSIS — R197 Diarrhea, unspecified: Secondary | ICD-10-CM | POA: Diagnosis not present

## 2019-12-31 DIAGNOSIS — Z96651 Presence of right artificial knee joint: Secondary | ICD-10-CM | POA: Diagnosis not present

## 2019-12-31 DIAGNOSIS — Z471 Aftercare following joint replacement surgery: Secondary | ICD-10-CM | POA: Diagnosis not present

## 2020-01-23 DIAGNOSIS — Z23 Encounter for immunization: Secondary | ICD-10-CM | POA: Diagnosis not present

## 2020-04-08 ENCOUNTER — Other Ambulatory Visit: Payer: Self-pay | Admitting: Obstetrics and Gynecology

## 2020-04-08 DIAGNOSIS — Z1231 Encounter for screening mammogram for malignant neoplasm of breast: Secondary | ICD-10-CM

## 2020-05-21 ENCOUNTER — Other Ambulatory Visit: Payer: Self-pay

## 2020-05-21 ENCOUNTER — Ambulatory Visit
Admission: RE | Admit: 2020-05-21 | Discharge: 2020-05-21 | Disposition: A | Discharge status: Home / Self Care | Payer: PPO | Source: Ambulatory Visit | Attending: Obstetrics and Gynecology | Admitting: Obstetrics and Gynecology

## 2020-05-21 DIAGNOSIS — Z1231 Encounter for screening mammogram for malignant neoplasm of breast: Secondary | ICD-10-CM

## 2020-06-14 DIAGNOSIS — R5383 Other fatigue: Secondary | ICD-10-CM | POA: Diagnosis not present

## 2020-06-14 DIAGNOSIS — M8588 Other specified disorders of bone density and structure, other site: Secondary | ICD-10-CM | POA: Diagnosis not present

## 2020-06-14 DIAGNOSIS — Z779 Other contact with and (suspected) exposures hazardous to health: Secondary | ICD-10-CM | POA: Diagnosis not present

## 2020-06-14 DIAGNOSIS — Z6831 Body mass index (BMI) 31.0-31.9, adult: Secondary | ICD-10-CM | POA: Diagnosis not present

## 2020-07-16 DIAGNOSIS — R350 Frequency of micturition: Secondary | ICD-10-CM | POA: Diagnosis not present

## 2020-07-16 DIAGNOSIS — R6889 Other general symptoms and signs: Secondary | ICD-10-CM | POA: Diagnosis not present

## 2020-09-09 DIAGNOSIS — R350 Frequency of micturition: Secondary | ICD-10-CM | POA: Diagnosis not present

## 2020-10-11 DIAGNOSIS — G43009 Migraine without aura, not intractable, without status migrainosus: Secondary | ICD-10-CM | POA: Diagnosis not present

## 2020-10-11 DIAGNOSIS — B078 Other viral warts: Secondary | ICD-10-CM | POA: Diagnosis not present

## 2020-10-20 DIAGNOSIS — M67872 Other specified disorders of synovium, left ankle and foot: Secondary | ICD-10-CM | POA: Diagnosis not present

## 2020-10-20 DIAGNOSIS — M67871 Other specified disorders of synovium, right ankle and foot: Secondary | ICD-10-CM | POA: Diagnosis not present

## 2020-10-25 DIAGNOSIS — H2513 Age-related nuclear cataract, bilateral: Secondary | ICD-10-CM | POA: Diagnosis not present

## 2020-10-25 DIAGNOSIS — H1045 Other chronic allergic conjunctivitis: Secondary | ICD-10-CM | POA: Diagnosis not present

## 2020-10-25 DIAGNOSIS — H353131 Nonexudative age-related macular degeneration, bilateral, early dry stage: Secondary | ICD-10-CM | POA: Diagnosis not present

## 2020-10-25 DIAGNOSIS — H40003 Preglaucoma, unspecified, bilateral: Secondary | ICD-10-CM | POA: Diagnosis not present

## 2020-10-27 DIAGNOSIS — Z96652 Presence of left artificial knee joint: Secondary | ICD-10-CM | POA: Diagnosis not present

## 2020-10-27 DIAGNOSIS — Z96653 Presence of artificial knee joint, bilateral: Secondary | ICD-10-CM | POA: Diagnosis not present

## 2020-10-27 DIAGNOSIS — Z96651 Presence of right artificial knee joint: Secondary | ICD-10-CM | POA: Diagnosis not present

## 2020-11-02 DIAGNOSIS — M7661 Achilles tendinitis, right leg: Secondary | ICD-10-CM | POA: Diagnosis not present

## 2020-11-02 DIAGNOSIS — M7662 Achilles tendinitis, left leg: Secondary | ICD-10-CM | POA: Diagnosis not present

## 2020-11-09 DIAGNOSIS — M7662 Achilles tendinitis, left leg: Secondary | ICD-10-CM | POA: Diagnosis not present

## 2020-11-09 DIAGNOSIS — M7661 Achilles tendinitis, right leg: Secondary | ICD-10-CM | POA: Diagnosis not present

## 2020-11-11 DIAGNOSIS — M7662 Achilles tendinitis, left leg: Secondary | ICD-10-CM | POA: Diagnosis not present

## 2020-11-11 DIAGNOSIS — M7661 Achilles tendinitis, right leg: Secondary | ICD-10-CM | POA: Diagnosis not present

## 2020-11-15 DIAGNOSIS — M7661 Achilles tendinitis, right leg: Secondary | ICD-10-CM | POA: Diagnosis not present

## 2020-11-15 DIAGNOSIS — M7662 Achilles tendinitis, left leg: Secondary | ICD-10-CM | POA: Diagnosis not present

## 2020-11-19 DIAGNOSIS — M7662 Achilles tendinitis, left leg: Secondary | ICD-10-CM | POA: Diagnosis not present

## 2020-11-19 DIAGNOSIS — M7661 Achilles tendinitis, right leg: Secondary | ICD-10-CM | POA: Diagnosis not present

## 2020-11-22 DIAGNOSIS — M7662 Achilles tendinitis, left leg: Secondary | ICD-10-CM | POA: Diagnosis not present

## 2020-11-22 DIAGNOSIS — M7661 Achilles tendinitis, right leg: Secondary | ICD-10-CM | POA: Diagnosis not present

## 2020-12-20 DIAGNOSIS — N3000 Acute cystitis without hematuria: Secondary | ICD-10-CM | POA: Diagnosis not present

## 2021-01-06 DIAGNOSIS — J019 Acute sinusitis, unspecified: Secondary | ICD-10-CM | POA: Diagnosis not present

## 2021-01-25 DIAGNOSIS — Z23 Encounter for immunization: Secondary | ICD-10-CM | POA: Diagnosis not present

## 2021-03-16 DIAGNOSIS — R0789 Other chest pain: Secondary | ICD-10-CM | POA: Diagnosis not present

## 2021-03-16 DIAGNOSIS — J019 Acute sinusitis, unspecified: Secondary | ICD-10-CM | POA: Diagnosis not present

## 2021-04-14 ENCOUNTER — Other Ambulatory Visit: Payer: Self-pay | Admitting: Obstetrics and Gynecology

## 2021-04-14 DIAGNOSIS — Z1231 Encounter for screening mammogram for malignant neoplasm of breast: Secondary | ICD-10-CM

## 2021-05-23 ENCOUNTER — Ambulatory Visit: Payer: PPO

## 2021-05-25 ENCOUNTER — Ambulatory Visit
Admission: RE | Admit: 2021-05-25 | Discharge: 2021-05-25 | Disposition: A | Discharge status: Home / Self Care | Payer: PPO | Source: Ambulatory Visit | Attending: Obstetrics and Gynecology | Admitting: Obstetrics and Gynecology

## 2021-05-25 DIAGNOSIS — Z1231 Encounter for screening mammogram for malignant neoplasm of breast: Secondary | ICD-10-CM

## 2021-06-30 DIAGNOSIS — Z124 Encounter for screening for malignant neoplasm of cervix: Secondary | ICD-10-CM | POA: Diagnosis not present

## 2021-06-30 DIAGNOSIS — Z1272 Encounter for screening for malignant neoplasm of vagina: Secondary | ICD-10-CM | POA: Diagnosis not present

## 2021-06-30 DIAGNOSIS — Z6832 Body mass index (BMI) 32.0-32.9, adult: Secondary | ICD-10-CM | POA: Diagnosis not present

## 2021-06-30 DIAGNOSIS — R32 Unspecified urinary incontinence: Secondary | ICD-10-CM | POA: Diagnosis not present

## 2021-07-04 DIAGNOSIS — L309 Dermatitis, unspecified: Secondary | ICD-10-CM | POA: Diagnosis not present

## 2021-07-04 DIAGNOSIS — D229 Melanocytic nevi, unspecified: Secondary | ICD-10-CM | POA: Diagnosis not present

## 2021-07-04 DIAGNOSIS — L578 Other skin changes due to chronic exposure to nonionizing radiation: Secondary | ICD-10-CM | POA: Diagnosis not present

## 2021-07-04 DIAGNOSIS — R234 Changes in skin texture: Secondary | ICD-10-CM | POA: Diagnosis not present

## 2021-07-04 DIAGNOSIS — L814 Other melanin hyperpigmentation: Secondary | ICD-10-CM | POA: Diagnosis not present

## 2021-07-04 DIAGNOSIS — L821 Other seborrheic keratosis: Secondary | ICD-10-CM | POA: Diagnosis not present

## 2021-07-04 DIAGNOSIS — B078 Other viral warts: Secondary | ICD-10-CM | POA: Diagnosis not present

## 2021-07-04 DIAGNOSIS — D1801 Hemangioma of skin and subcutaneous tissue: Secondary | ICD-10-CM | POA: Diagnosis not present

## 2021-10-05 DIAGNOSIS — N390 Urinary tract infection, site not specified: Secondary | ICD-10-CM | POA: Diagnosis not present

## 2021-10-20 DIAGNOSIS — R399 Unspecified symptoms and signs involving the genitourinary system: Secondary | ICD-10-CM | POA: Diagnosis not present

## 2021-10-20 DIAGNOSIS — R5383 Other fatigue: Secondary | ICD-10-CM | POA: Diagnosis not present

## 2021-10-20 DIAGNOSIS — N3001 Acute cystitis with hematuria: Secondary | ICD-10-CM | POA: Diagnosis not present

## 2021-11-11 DIAGNOSIS — R300 Dysuria: Secondary | ICD-10-CM | POA: Diagnosis not present

## 2021-11-11 DIAGNOSIS — R1030 Lower abdominal pain, unspecified: Secondary | ICD-10-CM | POA: Diagnosis not present

## 2021-11-16 ENCOUNTER — Other Ambulatory Visit: Payer: Self-pay | Admitting: Family Medicine

## 2021-11-16 ENCOUNTER — Other Ambulatory Visit: Payer: PPO

## 2021-11-16 DIAGNOSIS — N390 Urinary tract infection, site not specified: Secondary | ICD-10-CM

## 2021-11-16 DIAGNOSIS — R109 Unspecified abdominal pain: Secondary | ICD-10-CM

## 2021-11-17 ENCOUNTER — Ambulatory Visit
Admission: RE | Admit: 2021-11-17 | Discharge: 2021-11-17 | Disposition: A | Discharge status: Home / Self Care | Payer: PPO | Source: Ambulatory Visit | Attending: Family Medicine | Admitting: Family Medicine

## 2021-11-17 DIAGNOSIS — R109 Unspecified abdominal pain: Secondary | ICD-10-CM

## 2021-11-17 DIAGNOSIS — K760 Fatty (change of) liver, not elsewhere classified: Secondary | ICD-10-CM | POA: Diagnosis not present

## 2021-11-17 DIAGNOSIS — Z9049 Acquired absence of other specified parts of digestive tract: Secondary | ICD-10-CM | POA: Diagnosis not present

## 2021-11-17 DIAGNOSIS — N390 Urinary tract infection, site not specified: Secondary | ICD-10-CM | POA: Diagnosis not present

## 2021-11-18 ENCOUNTER — Other Ambulatory Visit: Payer: Self-pay | Admitting: Family Medicine

## 2021-11-18 DIAGNOSIS — R102 Pelvic and perineal pain: Secondary | ICD-10-CM

## 2021-11-21 ENCOUNTER — Ambulatory Visit
Admission: RE | Admit: 2021-11-21 | Discharge: 2021-11-21 | Disposition: A | Discharge status: Home / Self Care | Payer: PPO | Source: Ambulatory Visit | Attending: Family Medicine | Admitting: Family Medicine

## 2021-11-21 DIAGNOSIS — R102 Pelvic and perineal pain: Secondary | ICD-10-CM | POA: Diagnosis not present

## 2021-11-21 DIAGNOSIS — Z90710 Acquired absence of both cervix and uterus: Secondary | ICD-10-CM | POA: Diagnosis not present

## 2021-11-27 DIAGNOSIS — R197 Diarrhea, unspecified: Secondary | ICD-10-CM | POA: Diagnosis not present

## 2021-11-28 ENCOUNTER — Encounter (HOSPITAL_BASED_OUTPATIENT_CLINIC_OR_DEPARTMENT_OTHER): Payer: Self-pay

## 2021-11-28 ENCOUNTER — Emergency Department (HOSPITAL_BASED_OUTPATIENT_CLINIC_OR_DEPARTMENT_OTHER)
Admission: EM | Admit: 2021-11-28 | Discharge: 2021-11-28 | Disposition: A | Discharge status: Home / Self Care | Payer: PPO | Attending: Emergency Medicine | Admitting: Emergency Medicine

## 2021-11-28 ENCOUNTER — Emergency Department (HOSPITAL_BASED_OUTPATIENT_CLINIC_OR_DEPARTMENT_OTHER): Payer: PPO

## 2021-11-28 ENCOUNTER — Other Ambulatory Visit: Payer: Self-pay

## 2021-11-28 DIAGNOSIS — R109 Unspecified abdominal pain: Secondary | ICD-10-CM | POA: Diagnosis not present

## 2021-11-28 DIAGNOSIS — K2900 Acute gastritis without bleeding: Secondary | ICD-10-CM | POA: Insufficient documentation

## 2021-11-28 DIAGNOSIS — R1013 Epigastric pain: Secondary | ICD-10-CM

## 2021-11-28 DIAGNOSIS — Z91040 Latex allergy status: Secondary | ICD-10-CM | POA: Insufficient documentation

## 2021-11-28 DIAGNOSIS — R197 Diarrhea, unspecified: Secondary | ICD-10-CM | POA: Diagnosis not present

## 2021-11-28 LAB — CBC WITH DIFFERENTIAL/PLATELET
Abs Immature Granulocytes: 0.03 10*3/uL (ref 0.00–0.07)
Basophils Absolute: 0.1 10*3/uL (ref 0.0–0.1)
Basophils Relative: 1 %
Eosinophils Absolute: 0.1 10*3/uL (ref 0.0–0.5)
Eosinophils Relative: 1 %
HCT: 44.9 % (ref 36.0–46.0)
Hemoglobin: 15 g/dL (ref 12.0–15.0)
Immature Granulocytes: 0 %
Lymphocytes Relative: 30 %
Lymphs Abs: 2.3 10*3/uL (ref 0.7–4.0)
MCH: 29.2 pg (ref 26.0–34.0)
MCHC: 33.4 g/dL (ref 30.0–36.0)
MCV: 87.4 fL (ref 80.0–100.0)
Monocytes Absolute: 0.5 10*3/uL (ref 0.1–1.0)
Monocytes Relative: 7 %
Neutro Abs: 4.7 10*3/uL (ref 1.7–7.7)
Neutrophils Relative %: 61 %
Platelets: 323 10*3/uL (ref 150–400)
RBC: 5.14 MIL/uL — ABNORMAL HIGH (ref 3.87–5.11)
RDW: 13.2 % (ref 11.5–15.5)
WBC: 7.7 10*3/uL (ref 4.0–10.5)
nRBC: 0 % (ref 0.0–0.2)

## 2021-11-28 LAB — COMPREHENSIVE METABOLIC PANEL
ALT: 12 U/L (ref 0–44)
AST: 12 U/L — ABNORMAL LOW (ref 15–41)
Albumin: 4.3 g/dL (ref 3.5–5.0)
Alkaline Phosphatase: 47 U/L (ref 38–126)
Anion gap: 9 (ref 5–15)
BUN: 11 mg/dL (ref 8–23)
CO2: 24 mmol/L (ref 22–32)
Calcium: 9 mg/dL (ref 8.9–10.3)
Chloride: 102 mmol/L (ref 98–111)
Creatinine, Ser: 0.87 mg/dL (ref 0.44–1.00)
GFR, Estimated: >60 mL/min (ref >60)
Glucose, Bld: 95 mg/dL (ref 70–99)
Potassium: 3.9 mmol/L (ref 3.5–5.1)
Sodium: 135 mmol/L (ref 135–145)
Total Bilirubin: 0.3 mg/dL (ref 0.3–1.2)
Total Protein: 7.5 g/dL (ref 6.5–8.1)

## 2021-11-28 LAB — URINALYSIS, ROUTINE W REFLEX MICROSCOPIC
Bilirubin Urine: NEGATIVE
Glucose, UA: NEGATIVE mg/dL
Hgb urine dipstick: NEGATIVE
Ketones, ur: NEGATIVE mg/dL
Nitrite: NEGATIVE
Protein, ur: NEGATIVE mg/dL
Specific Gravity, Urine: 1.005 (ref 1.005–1.030)
pH: 7 (ref 5.0–8.0)

## 2021-11-28 LAB — LIPASE, BLOOD: Lipase: 28 U/L (ref 11–51)

## 2021-11-28 LAB — C DIFFICILE QUICK SCREEN W PCR REFLEX
C Diff antigen: NEGATIVE
C Diff interpretation: NOT DETECTED
C Diff toxin: NEGATIVE

## 2021-11-28 MED ORDER — LACTATED RINGERS IV BOLUS
1000.0000 mL | Freq: Once | INTRAVENOUS | Status: AC
  Administered 2021-11-28: 1000 mL via INTRAVENOUS

## 2021-11-28 MED ORDER — ONDANSETRON 4 MG PO TBDP: 4.0000 mg | ORAL_TABLET | Freq: Three times a day (TID) | ORAL | 0 refills | Status: AC | PRN

## 2021-11-28 MED ORDER — ALUM & MAG HYDROXIDE-SIMETH 200-200-20 MG/5ML PO SUSP
30.0000 mL | Freq: Once | ORAL | Status: AC
  Administered 2021-11-28: 30 mL via ORAL
  Filled 2021-11-28: qty 30

## 2021-11-28 MED ORDER — IOHEXOL 300 MG/ML  SOLN
100.0000 mL | Freq: Once | INTRAMUSCULAR | Status: AC | PRN
  Administered 2021-11-28: 85 mL via INTRAVENOUS

## 2021-11-28 MED ORDER — PANTOPRAZOLE SODIUM 20 MG PO TBEC: 40.0000 mg | DELAYED_RELEASE_TABLET | Freq: Every day | ORAL | 0 refills | Status: AC

## 2021-11-28 NOTE — ED Triage Notes (Signed)
Pt recently took Mag citrate for her bowels and now she is having bloating and distention. Her doctor wanted her tested for C-diff, Ova+Parasite and Giardia lamblia.

## 2021-11-28 NOTE — ED Provider Notes (Signed)
[facility] Provider Note   CSN: [id number] Arrival date & time: 11/28/21  0730     History  Chief Complaint  Patient presents with   Bloated    Dorothy Garcia is a 66 y.o. female.  HPI      About one month ago had UTI symptoms, started abx, called her to say she needed another abx, then began to take another abx, then another. Was on 4 abx. Had yeast infection.  Right kidney hurts, pressure down below.  Dr ordered abdominal US, pelvic US showed no significant findings. Friday drank bottle of magnesium citrate, after that went nonstop for 12 hours, then Saturday began to have burtning epigastric pain, distention, fullness, felt horrible. Went to [place] walk in yesterday and saw PCP, thought it was cdiff, recommend going to ED.    Any time eating anything will go, have diarrhea. Having very little since not eating much. Bloating. Trying yogurt. Not having diarrhea prior to mag citrate. Had the abdominal pain.  Drinking water.    Now having burning pain LUQ, nausea comes in waves. No vomiting.  No dysuria, yesterday had some lower pressure with urination but it gone now.  Is under a lot of stress.  Pain worsens with eating.  Home Medications Prior to Admission medications   Medication Sig Start Date End Date Taking? Authorizing Provider  ALPRAZolam Duanne Moron) 1 MG tablet Take 1 mg by mouth at bedtime as needed for anxiety.   Yes [provider]  estradiol (ESTRACE) 1 MG tablet Take 1 mg by mouth daily.   Yes [provider]  fluconazole (DIFLUCAN) 200 MG tablet Take 200 mg by mouth daily.   Yes [provider]  fluticasone (FLONASE) 50 MCG/ACT nasal spray Place into both nostrils daily.   Yes [provider]  loratadine (CLARITIN) 10 MG tablet Take 10 mg by mouth daily.   Yes [provider]  nystatin-triamcinolone (MYCOLOG II) cream Apply 1 Application topically 2 (two) times daily.   Yes [provider]   ondansetron (ZOFRAN-ODT) 4 MG disintegrating tablet Take 1 tablet (4 mg total) by mouth every 8 (eight) hours as needed for nausea or vomiting. 11/28/21  Yes Gareth Morgan, MD  pantoprazole (PROTONIX) 20 MG tablet Take 2 tablets (40 mg total) by mouth daily for 14 days. 11/28/21 12/12/21 Yes Gareth Morgan, MD  sertraline (ZOLOFT) 25 MG tablet Take 25 mg by mouth daily.   Yes [provider]      Allergies    Other, Shellfish allergy, Egg [eggs or egg-derived products], Codeine, and Latex    Review of Systems   Review of Systems  Physical Exam Updated Vital Signs BP (!) 142/99   Pulse 95   Temp 98.6 F (37 C) (Oral)   Resp 16   Ht '5\' 4"'$  (1.626 m)   Wt 85.3 kg   SpO2 100%   BMI 32.27 kg/m  Physical Exam Vitals and nursing note reviewed.  Constitutional:      General: She is not in acute distress.    Appearance: She is well-developed. She is not diaphoretic.  HENT:     Head: Normocephalic and atraumatic.  Eyes:     Conjunctiva/sclera: Conjunctivae normal.  Cardiovascular:     Rate and Rhythm: Normal rate and regular rhythm.     Heart sounds: Normal heart sounds. No murmur heard.    No friction rub. No gallop.  Pulmonary:     Effort: Pulmonary effort is normal. No respiratory distress.  Breath sounds: Normal breath sounds. No wheezing or rales.  Abdominal:     General: There is no distension.     Palpations: Abdomen is soft.     Tenderness: There is abdominal tenderness (mild epigastric, LUQ). There is no guarding.  Musculoskeletal:        General: No tenderness.     Cervical back: Normal range of motion.  Skin:    General: Skin is warm and dry.     Findings: No erythema or rash.  Neurological:     Mental Status: She is alert and oriented to person, place, and time.     ED Results / Procedures / Treatments   Labs (all labs ordered are listed, but only abnormal results are displayed) Labs Reviewed  URINE CULTURE - Abnormal; Notable for the following  components:      Result Value   Culture MULTIPLE SPECIES PRESENT, SUGGEST RECOLLECTION (*)    All other components within normal limits  CBC WITH DIFFERENTIAL/PLATELET - Abnormal; Notable for the following components:   RBC 5.14 (*)    All other components within normal limits  COMPREHENSIVE METABOLIC PANEL - Abnormal; Notable for the following components:   AST 12 (*)    All other components within normal limits  URINALYSIS, ROUTINE W REFLEX MICROSCOPIC - Abnormal; Notable for the following components:   Leukocytes,Ua LARGE (*)    All other components within normal limits  GASTROINTESTINAL PANEL BY PCR, STOOL (REPLACES STOOL CULTURE)  C DIFFICILE QUICK SCREEN W PCR REFLEX    OVA + PARASITE EXAM  LIPASE, BLOOD    EKG EKG Interpretation  Date/Time:  Monday November 28 2021 12:01:30 EDT Ventricular Rate:  77 PR Interval:  132 QRS Duration: 100 QT Interval:  418 QTC Calculation: 474 R Axis:   55 Text Interpretation: Sinus rhythm Borderline T wave abnormalities No significant change since last tracing Confirmed by Gareth Morgan [id number]) on 11/28/2021 12:03:53 PM  Radiology CT ABDOMEN PELVIS W CONTRAST  Result Date: 11/28/2021 CLINICAL DATA:  Left lower quadrant pain. Abdominal bloating and distention. EXAM: CT ABDOMEN AND PELVIS WITH CONTRAST TECHNIQUE: Multidetector CT imaging of the abdomen and pelvis was performed using the standard protocol following bolus administration of intravenous contrast. RADIATION DOSE REDUCTION: This exam was performed according to the departmental dose-optimization program which includes automated exposure control, adjustment of the mA and/or kV according to patient size and/or use of iterative reconstruction technique. CONTRAST:  71m OMNIPAQUE IOHEXOL 300 MG/ML  SOLN COMPARISON:  None Available. FINDINGS: Lower Chest: No acute findings. Hepatobiliary:  No hepatic masses identified. Pancreas:  No mass or inflammatory changes. Spleen: Within normal limits  in size and appearance. Adrenals/Urinary Tract: No suspicious masses identified. No evidence of ureteral calculi or hydronephrosis. Stomach/Bowel: No evidence of obstruction, inflammatory process or abnormal fluid collections. Vascular/Lymphatic: No pathologically enlarged lymph nodes. No acute vascular findings. Reproductive: Prior hysterectomy noted. Adnexal regions are unremarkable in appearance. Other:  None. Musculoskeletal:  No suspicious bone lesions identified. IMPRESSION: Negative. No acute findings or other significant abnormality. Electronically Signed   By: JMarlaine HindM.D.   On: 11/28/2021 11:06    Procedures Procedures    Medications Ordered in ED Medications  lactated ringers bolus 1,000 mL (0 mLs Intravenous Stopped 11/28/21 1021)  iohexol (OMNIPAQUE) 300 MG/ML solution 100 mL (85 mLs Intravenous Contrast Given 11/28/21 1045)  alum & mag hydroxide-simeth (MAALOX/MYLANTA) 200-200-20 MG/5ML suspension 30 mL (30 mLs Oral Given 11/28/21 1158)    ED Course/ Medical Decision Making/ A&P  66yo female with history of arthritis, cholecystectomy, hysterectomy, presents with concern for one month of abdominal pain on recent antibiotics, development of diarrhea over last few days after taking one dose mag citrate and being recommended to come to ED for evalaution for C Diff.    DDx includes appendicitis, pancreatitis, choledocolithiasis, pyelonephritis, nephrolithiasis, diverticulitis, ACS, SBO, AAA, perforated viscous, colitis.   Labs completed and personally evaluated and interpreted by me show no leukocytosis, no anemia, no significant electrolyte abnormality, no hepatitis or pancreatitis.  UA questionable for UTI, no bacteria--give no continuing urinary symptoms, recent abx and diarrhea will hold on treatment until urine cx.    CT abdomen pelvis personally evaluated by me and radiology and shows no significant abnormality.  EKG without acute findings. Has abdominal  tenderness, pain worse with eating, diarrhea, doubt ACS. GI pathogen/CDIff ordered and pending.  Suspect most likely gastritis/gastroenteritis.. Will give rx for zofran and pantoprazole and recommend close outpatient follow up. Patient discharged in stable condition with understanding of reasons to return.          Final Clinical Impression(s) / ED Diagnoses Final diagnoses:  Abdominal pain, epigastric  Diarrhea, unspecified type  Acute gastritis without hemorrhage, unspecified gastritis type    Rx / DC Orders ED Discharge Orders          Ordered    pantoprazole (PROTONIX) 20 MG tablet  Daily        11/28/21 1206    ondansetron (ZOFRAN-ODT) 4 MG disintegrating tablet  Every 8 hours PRN        11/28/21 1206              Gareth Morgan, MD 11/29/21 2356

## 2021-11-29 LAB — GASTROINTESTINAL PANEL BY PCR, STOOL (REPLACES STOOL CULTURE)

## 2021-11-29 LAB — URINE CULTURE

## 2021-11-30 DIAGNOSIS — G43009 Migraine without aura, not intractable, without status migrainosus: Secondary | ICD-10-CM | POA: Diagnosis not present

## 2021-11-30 DIAGNOSIS — R109 Unspecified abdominal pain: Secondary | ICD-10-CM | POA: Diagnosis not present

## 2021-11-30 DIAGNOSIS — N390 Urinary tract infection, site not specified: Secondary | ICD-10-CM | POA: Diagnosis not present

## 2021-12-01 LAB — O&P RESULT

## 2021-12-01 LAB — OVA + PARASITE EXAM

## 2021-12-19 NOTE — Progress Notes (Signed)
Evaluated with GI pathogen panel and CDiff to evaluate for etiology of diarrhea.

## 2021-12-23 DIAGNOSIS — R3982 Chronic bladder pain: Secondary | ICD-10-CM | POA: Diagnosis not present

## 2021-12-23 DIAGNOSIS — N3020 Other chronic cystitis without hematuria: Secondary | ICD-10-CM | POA: Diagnosis not present

## 2022-02-09 DIAGNOSIS — B078 Other viral warts: Secondary | ICD-10-CM | POA: Diagnosis not present

## 2022-03-24 DIAGNOSIS — U071 COVID-19: Secondary | ICD-10-CM | POA: Diagnosis not present

## 2022-03-31 DIAGNOSIS — N3020 Other chronic cystitis without hematuria: Secondary | ICD-10-CM | POA: Diagnosis not present

## 2022-03-31 DIAGNOSIS — R3982 Chronic bladder pain: Secondary | ICD-10-CM | POA: Diagnosis not present

## 2022-04-03 DIAGNOSIS — K219 Gastro-esophageal reflux disease without esophagitis: Secondary | ICD-10-CM | POA: Diagnosis not present

## 2022-04-11 ENCOUNTER — Other Ambulatory Visit: Payer: Self-pay | Admitting: Obstetrics and Gynecology

## 2022-04-11 DIAGNOSIS — Z1231 Encounter for screening mammogram for malignant neoplasm of breast: Secondary | ICD-10-CM

## 2022-04-20 IMAGING — MG MM DIGITAL SCREENING BILAT W/ TOMO AND CAD
6 of 10 series · 6 of 30 positions shown · non-contrast
Comparison: Previous exam(s).

CLINICAL DATA: Screening.

EXAM:
DIGITAL SCREENING BILATERAL MAMMOGRAM WITH TOMOSYNTHESIS AND CAD
TECHNIQUE: Bilateral screening digital craniocaudal and mediolateral oblique
mammograms were obtained. Bilateral screening digital breast
tomosynthesis was performed. The images were evaluated with
computer-aided detection.

[L MLO synth-2D (1 of 2)]
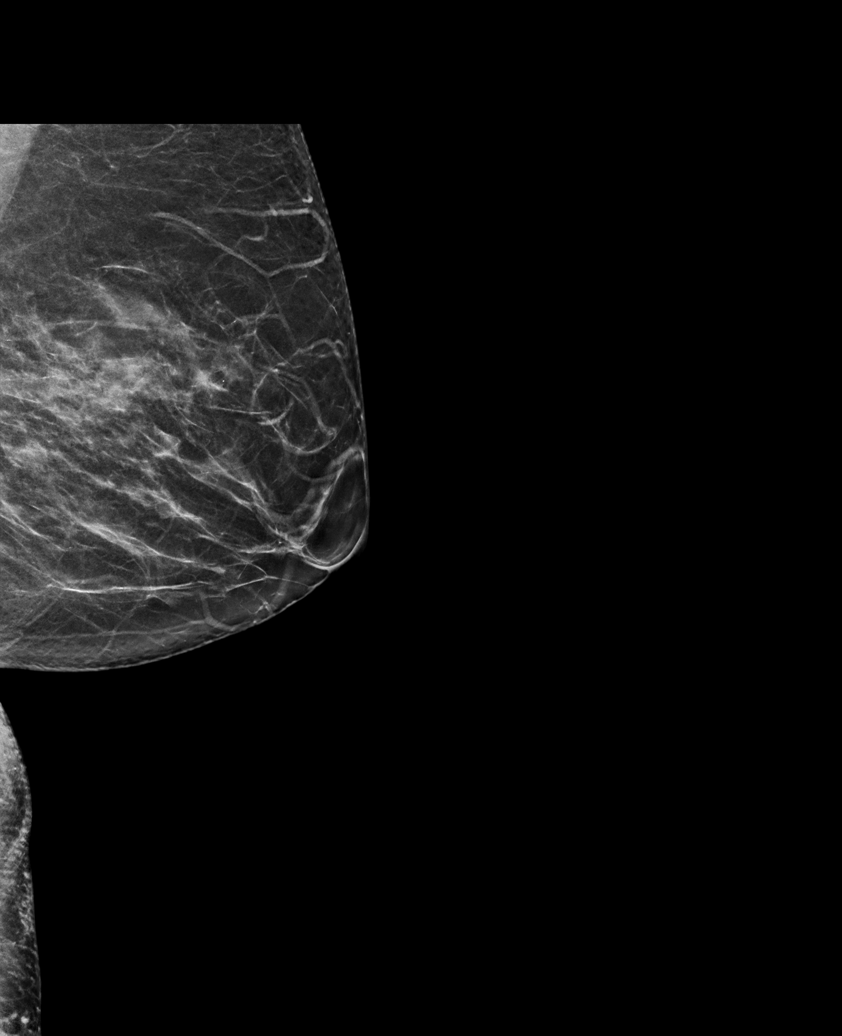

[L CC synth-2D]
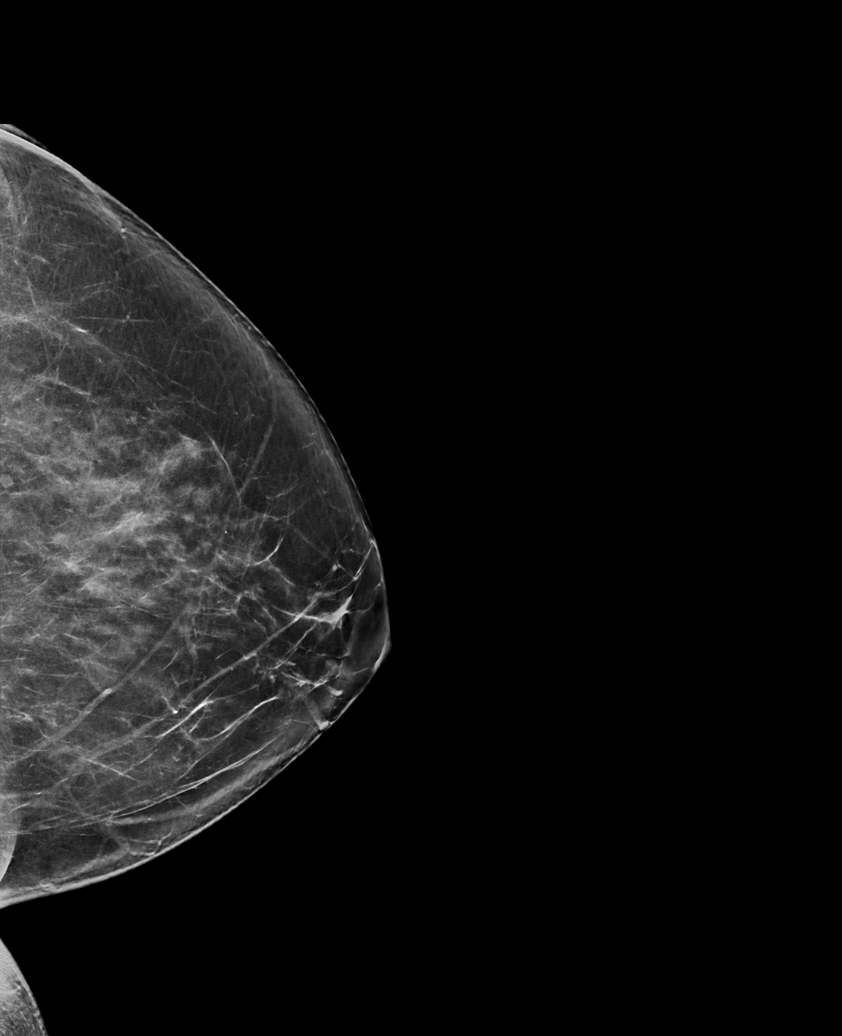

[R MLO synth-2D]
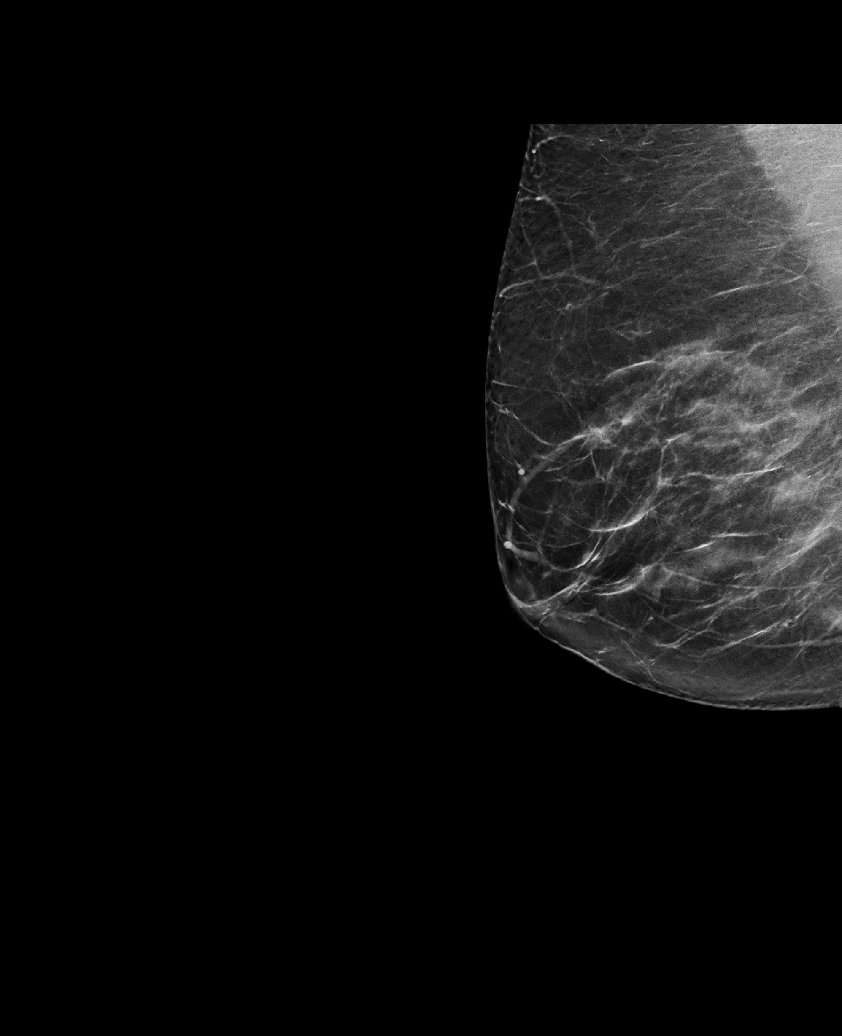

[R CC synth-2D]
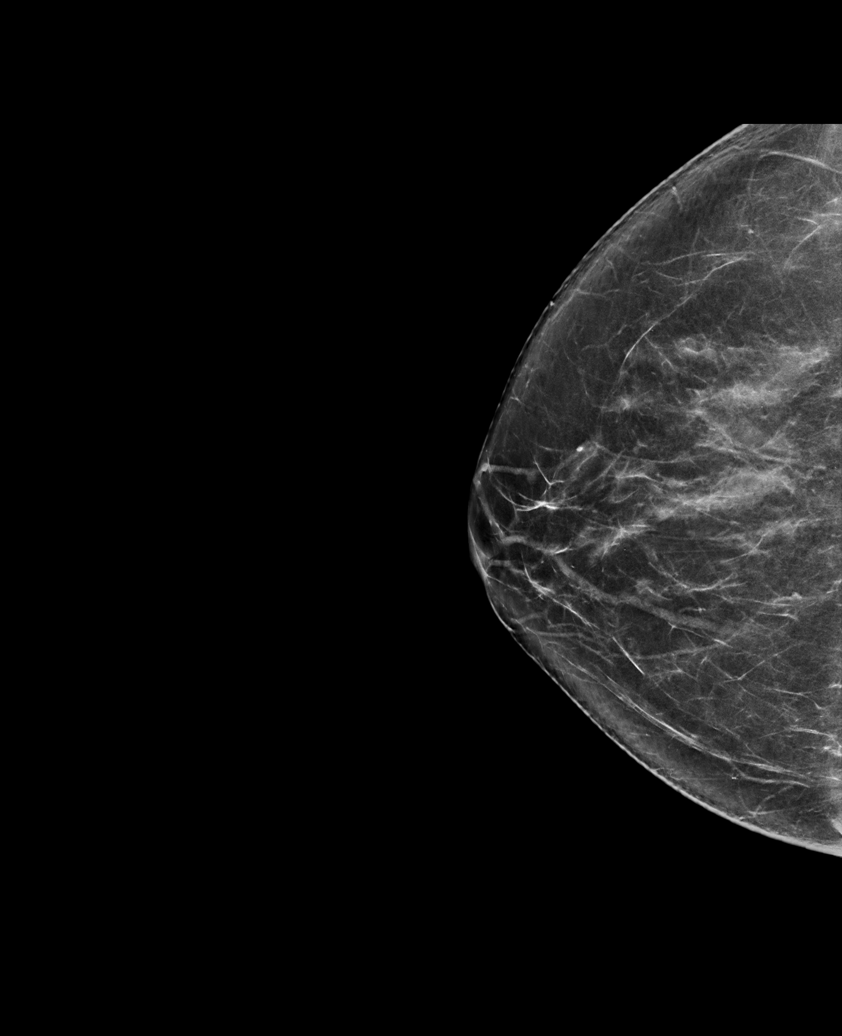

[L MLO synth-2D (2 of 2)]
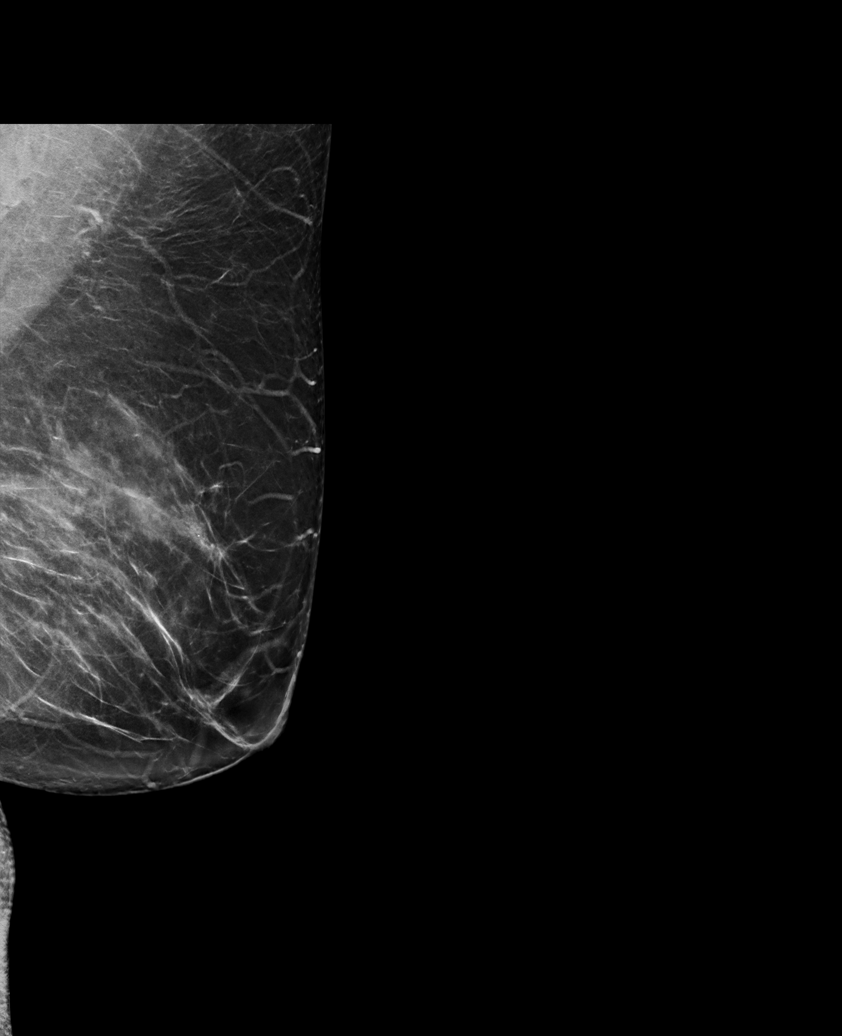

[R CC tomo · tomo slice 37/74.0]
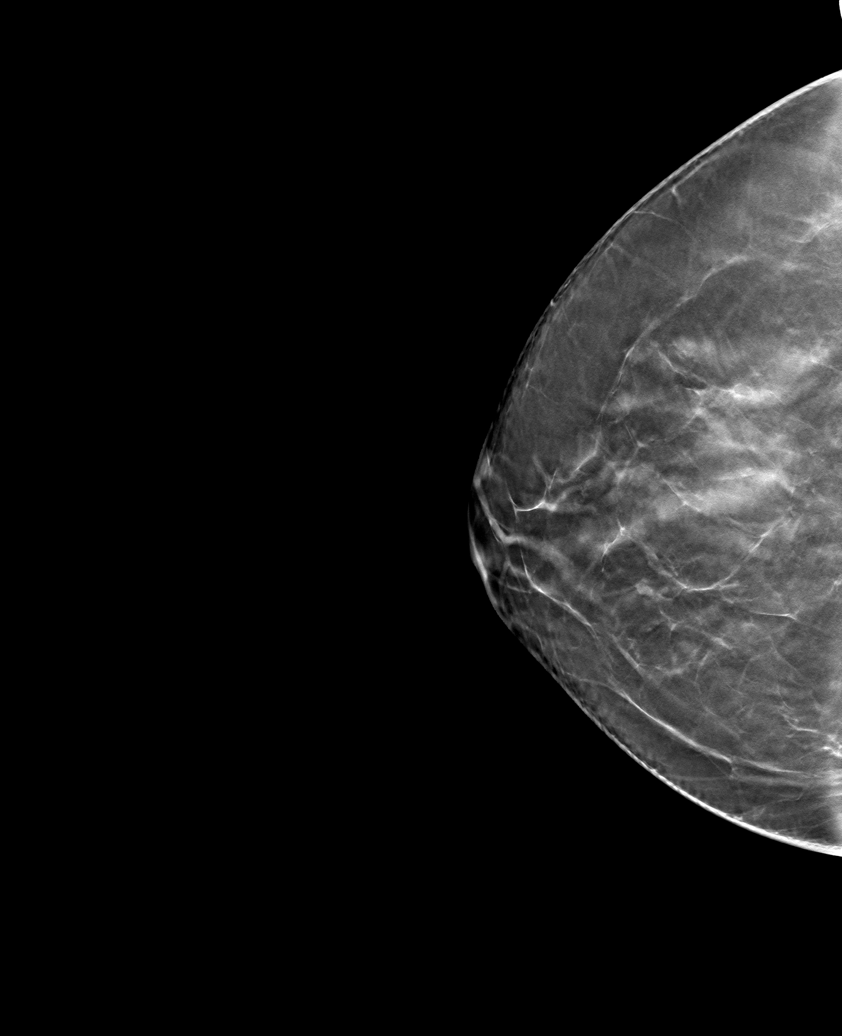

[6 of 30 positions shown; findings below may reference images not displayed]

ACR Breast Density Category b: There are scattered areas of
fibroglandular density.
FINDINGS: There are no findings suspicious for malignancy.
IMPRESSION: No mammographic evidence of malignancy. A result letter of this
screening mammogram will be mailed directly to the patient.

RECOMMENDATION:
Screening mammogram in one year. (Code:51-O-LD2)

BI-RADS CATEGORY  1: Negative.

## 2022-04-27 DIAGNOSIS — B349 Viral infection, unspecified: Secondary | ICD-10-CM | POA: Diagnosis not present

## 2022-04-27 DIAGNOSIS — J029 Acute pharyngitis, unspecified: Secondary | ICD-10-CM | POA: Diagnosis not present

## 2022-04-27 DIAGNOSIS — J0100 Acute maxillary sinusitis, unspecified: Secondary | ICD-10-CM | POA: Diagnosis not present

## 2022-05-16 DIAGNOSIS — N951 Menopausal and female climacteric states: Secondary | ICD-10-CM | POA: Diagnosis not present

## 2022-05-16 DIAGNOSIS — Z1211 Encounter for screening for malignant neoplasm of colon: Secondary | ICD-10-CM | POA: Diagnosis not present

## 2022-05-16 DIAGNOSIS — E7800 Pure hypercholesterolemia, unspecified: Secondary | ICD-10-CM | POA: Diagnosis not present

## 2022-05-16 DIAGNOSIS — F419 Anxiety disorder, unspecified: Secondary | ICD-10-CM | POA: Diagnosis not present

## 2022-05-16 DIAGNOSIS — Z0000 Encounter for general adult medical examination without abnormal findings: Secondary | ICD-10-CM | POA: Diagnosis not present

## 2022-05-16 DIAGNOSIS — Z136 Encounter for screening for cardiovascular disorders: Secondary | ICD-10-CM | POA: Diagnosis not present

## 2022-05-16 DIAGNOSIS — B078 Other viral warts: Secondary | ICD-10-CM | POA: Diagnosis not present

## 2022-05-16 DIAGNOSIS — Z23 Encounter for immunization: Secondary | ICD-10-CM | POA: Diagnosis not present

## 2022-05-17 ENCOUNTER — Other Ambulatory Visit: Payer: Self-pay | Admitting: Family Medicine

## 2022-05-17 DIAGNOSIS — Z136 Encounter for screening for cardiovascular disorders: Secondary | ICD-10-CM

## 2022-05-30 ENCOUNTER — Ambulatory Visit
Admission: RE | Admit: 2022-05-30 | Discharge: 2022-05-30 | Disposition: A | Discharge status: Home / Self Care | Payer: PPO | Source: Ambulatory Visit | Attending: Obstetrics and Gynecology | Admitting: Obstetrics and Gynecology

## 2022-05-30 DIAGNOSIS — Z1231 Encounter for screening mammogram for malignant neoplasm of breast: Secondary | ICD-10-CM | POA: Diagnosis not present

## 2022-06-08 ENCOUNTER — Ambulatory Visit
Admission: RE | Admit: 2022-06-08 | Discharge: 2022-06-08 | Disposition: A | Discharge status: Home / Self Care | Payer: No Typology Code available for payment source | Source: Ambulatory Visit | Attending: Family Medicine | Admitting: Family Medicine

## 2022-06-08 DIAGNOSIS — Z136 Encounter for screening for cardiovascular disorders: Secondary | ICD-10-CM

## 2022-06-08 DIAGNOSIS — Z8249 Family history of ischemic heart disease and other diseases of the circulatory system: Secondary | ICD-10-CM | POA: Diagnosis not present

## 2022-08-02 DIAGNOSIS — N3020 Other chronic cystitis without hematuria: Secondary | ICD-10-CM | POA: Diagnosis not present

## 2022-08-02 DIAGNOSIS — N952 Postmenopausal atrophic vaginitis: Secondary | ICD-10-CM | POA: Diagnosis not present

## 2022-08-02 DIAGNOSIS — R3982 Chronic bladder pain: Secondary | ICD-10-CM | POA: Diagnosis not present

## 2022-09-06 DIAGNOSIS — Z01419 Encounter for gynecological examination (general) (routine) without abnormal findings: Secondary | ICD-10-CM | POA: Diagnosis not present

## 2022-09-06 DIAGNOSIS — Z6831 Body mass index (BMI) 31.0-31.9, adult: Secondary | ICD-10-CM | POA: Diagnosis not present

## 2022-09-06 DIAGNOSIS — Z8262 Family history of osteoporosis: Secondary | ICD-10-CM | POA: Diagnosis not present

## 2022-09-06 DIAGNOSIS — M8588 Other specified disorders of bone density and structure, other site: Secondary | ICD-10-CM | POA: Diagnosis not present

## 2022-09-06 DIAGNOSIS — N952 Postmenopausal atrophic vaginitis: Secondary | ICD-10-CM | POA: Diagnosis not present

## 2022-09-06 DIAGNOSIS — Z90710 Acquired absence of both cervix and uterus: Secondary | ICD-10-CM | POA: Diagnosis not present

## 2022-09-06 DIAGNOSIS — N959 Unspecified menopausal and perimenopausal disorder: Secondary | ICD-10-CM | POA: Diagnosis not present

## 2022-09-06 DIAGNOSIS — N958 Other specified menopausal and perimenopausal disorders: Secondary | ICD-10-CM | POA: Diagnosis not present

## 2022-09-15 DIAGNOSIS — Z09 Encounter for follow-up examination after completed treatment for conditions other than malignant neoplasm: Secondary | ICD-10-CM | POA: Diagnosis not present

## 2022-09-15 DIAGNOSIS — K644 Residual hemorrhoidal skin tags: Secondary | ICD-10-CM | POA: Diagnosis not present

## 2022-09-15 DIAGNOSIS — D123 Benign neoplasm of transverse colon: Secondary | ICD-10-CM | POA: Diagnosis not present

## 2022-09-15 DIAGNOSIS — D124 Benign neoplasm of descending colon: Secondary | ICD-10-CM | POA: Diagnosis not present

## 2022-09-15 DIAGNOSIS — Z86010 Personal history of colonic polyps: Secondary | ICD-10-CM | POA: Diagnosis not present

## 2022-09-15 DIAGNOSIS — K648 Other hemorrhoids: Secondary | ICD-10-CM | POA: Diagnosis not present

## 2022-09-19 DIAGNOSIS — D124 Benign neoplasm of descending colon: Secondary | ICD-10-CM | POA: Diagnosis not present

## 2022-10-06 DIAGNOSIS — N952 Postmenopausal atrophic vaginitis: Secondary | ICD-10-CM | POA: Diagnosis not present

## 2022-10-06 DIAGNOSIS — N3020 Other chronic cystitis without hematuria: Secondary | ICD-10-CM | POA: Diagnosis not present

## 2022-10-06 DIAGNOSIS — R3982 Chronic bladder pain: Secondary | ICD-10-CM | POA: Diagnosis not present

## 2022-11-13 DIAGNOSIS — H6591 Unspecified nonsuppurative otitis media, right ear: Secondary | ICD-10-CM | POA: Diagnosis not present

## 2022-11-13 DIAGNOSIS — J309 Allergic rhinitis, unspecified: Secondary | ICD-10-CM | POA: Diagnosis not present

## 2022-11-15 DIAGNOSIS — F419 Anxiety disorder, unspecified: Secondary | ICD-10-CM | POA: Diagnosis not present

## 2022-11-15 DIAGNOSIS — E7800 Pure hypercholesterolemia, unspecified: Secondary | ICD-10-CM | POA: Diagnosis not present

## 2022-11-15 DIAGNOSIS — N951 Menopausal and female climacteric states: Secondary | ICD-10-CM | POA: Diagnosis not present

## 2022-11-15 DIAGNOSIS — R030 Elevated blood-pressure reading, without diagnosis of hypertension: Secondary | ICD-10-CM | POA: Diagnosis not present

## 2023-04-17 ENCOUNTER — Other Ambulatory Visit: Payer: Self-pay | Admitting: Obstetrics and Gynecology

## 2023-04-17 DIAGNOSIS — Z1231 Encounter for screening mammogram for malignant neoplasm of breast: Secondary | ICD-10-CM

## 2023-05-29 DIAGNOSIS — R053 Chronic cough: Secondary | ICD-10-CM | POA: Diagnosis not present

## 2023-06-01 ENCOUNTER — Ambulatory Visit
Admission: RE | Admit: 2023-06-01 | Discharge: 2023-06-01 | Disposition: A | Discharge status: Home / Self Care | Payer: PPO | Source: Ambulatory Visit | Attending: Obstetrics and Gynecology | Admitting: Obstetrics and Gynecology

## 2023-06-01 DIAGNOSIS — Z1231 Encounter for screening mammogram for malignant neoplasm of breast: Secondary | ICD-10-CM

## 2023-06-05 DIAGNOSIS — R509 Fever, unspecified: Secondary | ICD-10-CM | POA: Diagnosis not present

## 2023-06-05 DIAGNOSIS — J101 Influenza due to other identified influenza virus with other respiratory manifestations: Secondary | ICD-10-CM | POA: Diagnosis not present

## 2023-06-13 ENCOUNTER — Ambulatory Visit: Admitting: Podiatry

## 2023-06-13 DIAGNOSIS — Q666 Other congenital valgus deformities of feet: Secondary | ICD-10-CM

## 2023-06-13 NOTE — Progress Notes (Signed)
 Subjective:  Patient ID: Dorothy Garcia, female    DOB: [birth year],  MRN: [id number]  Chief Complaint  Patient presents with   Callouses    Right great toe pt stated that she has a callus on the side of her toe she stated that it does not cause her any discomfort its just ugly     68 y.o. female presents with the above complaint.  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  P she states it does not bother her or cause her any pain she just wanted get it evaluated.  She denies any other acute complaints.  Pain scale is 0 out of 10.  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  She does not wear any orthotics denies any other acute issues   Review of Systems: Negative except as noted in the HPI. Denies N/V/F/Ch.  Past Medical History:  Diagnosis Date   Anxiety    Arthritis    Complication of anesthesia    panic attack after surgery.    Current Outpatient Medications:    amLODipine (NORVASC) 2.5 MG tablet, Take 2.5 mg by mouth daily., Disp: , Rfl:    albuterol (VENTOLIN HFA) 108 (90 Base) MCG/ACT inhaler, INHALE 1 PUFF BY MOUTH EVERY 4 HOURS AS NEEDED, Disp: , Rfl:    ALPRAZolam (XANAX) 1 MG tablet, Take 1 mg by mouth at bedtime as needed for anxiety., Disp: , Rfl:    aspirin 81 MG chewable tablet, aspirin 81 mg chewable tablet  CHEW AND SWALLOW 1 TABLET BY MOUTH TWICE DAILY FOR 4 WEEKS, Disp: , Rfl:    azithromycin (ZITHROMAX) 200 MG/5ML suspension, azithromycin 200 mg/5 mL oral suspension, Disp: , Rfl:    celecoxib (CELEBREX) 200 MG capsule, celecoxib 200 mg capsule  TAKE 1 CAPSULE BY MOUTH TWICE DAILY WITH MEALS, Disp: , Rfl:    estradiol (ESTRACE) 1 MG tablet, Take 1 mg by mouth daily., Disp: , Rfl:    fluconazole (DIFLUCAN) 200 MG tablet, Take 200 mg by mouth daily., Disp: , Rfl:    fluticasone (FLONASE) 50 MCG/ACT nasal spray, Place into both nostrils daily., Disp: , Rfl:    loratadine (CLARITIN) 10 MG  tablet, Take 10 mg by mouth daily., Disp: , Rfl:    nystatin-triamcinolone (MYCOLOG II) cream, Apply 1 Application topically 2 (two) times daily., Disp: , Rfl:    ondansetron (ZOFRAN-ODT) 4 MG disintegrating tablet, Take 1 tablet (4 mg total) by mouth every 8 (eight) hours as needed for nausea or vomiting., Disp: 20 tablet, Rfl: 0   pantoprazole (PROTONIX) 20 MG tablet, Take 2 tablets (40 mg total) by mouth daily for 14 days., Disp: 28 tablet, Rfl: 0   sertraline (ZOLOFT) 25 MG tablet, Take 25 mg by mouth daily., Disp: , Rfl:   Social History   Tobacco Use  Smoking Status Never  Smokeless Tobacco Never    Allergies  Allergen Reactions   Other Anaphylaxis    All nuts    Shellfish Allergy Anaphylaxis   Egg [Egg-Derived Products] Swelling   Misc. Sulfonamide Containing Compounds     Other Reaction(s): Not available   Codeine Rash   Latex Rash   Objective:  There were no vitals filed for this visit. There is no height or weight on file to calculate BMI. Constitutional Well developed. Well nourished.  Vascular Dorsalis pedis pulses palpable bilaterally. Posterior tibial pulses palpable bilaterally. Capillary refill normal to all digits.  No cyanosis or clubbing noted. Pedal  hair growth normal.  Neurologic Normal speech. Oriented to person, place, and time. Epicritic sensation to light touch grossly present bilaterally.  Dermatologic Nails well groomed and normal in appearance. No open wounds. No skin lesions.  Orthopedic: Gait examination shows pes planovalgus foot structure with calcaneovalgus to many toe signs partially recurred the arch with dorsiflexion of the hallux unable to perform single and double heel raise.   Radiographs: None Assessment:  No diagnosis found. Plan:  Patient was evaluated and treated and all questions answered.  Pes planovalgus -I explained to patient the etiology of pes planovalgus and relationship with Planter fasciitis and various treatment  options were discussed.  Given patient foot structure in the setting of Planter fasciitis I believe patient will benefit from custom-made orthotics to help control the hindfoot motion support the arch of the foot and take the stress away from plantar fascial.  Patient agrees with the plan like to proceed with orthotics -Patient was casted for orthotics  -  No follow-ups on file.

## 2023-06-17 NOTE — Progress Notes (Signed)
  Orthotic order placed will schedule for fitting when in

## 2023-06-28 DIAGNOSIS — K219 Gastro-esophageal reflux disease without esophagitis: Secondary | ICD-10-CM | POA: Diagnosis not present

## 2023-06-28 DIAGNOSIS — J309 Allergic rhinitis, unspecified: Secondary | ICD-10-CM | POA: Diagnosis not present

## 2023-06-28 DIAGNOSIS — R058 Other specified cough: Secondary | ICD-10-CM | POA: Diagnosis not present

## 2023-07-04 ENCOUNTER — Telehealth: Payer: Self-pay

## 2023-07-04 NOTE — Telephone Encounter (Signed)
 Left vm to schedule orthotic pick up

## 2023-07-24 DIAGNOSIS — U071 COVID-19: Secondary | ICD-10-CM | POA: Diagnosis not present

## 2023-07-30 ENCOUNTER — Institutional Professional Consult (permissible substitution): Admitting: Pulmonary Disease

## 2023-08-08 DIAGNOSIS — R058 Other specified cough: Secondary | ICD-10-CM | POA: Diagnosis not present

## 2023-08-08 DIAGNOSIS — K219 Gastro-esophageal reflux disease without esophagitis: Secondary | ICD-10-CM | POA: Diagnosis not present

## 2023-08-08 DIAGNOSIS — J309 Allergic rhinitis, unspecified: Secondary | ICD-10-CM | POA: Diagnosis not present

## 2023-08-08 DIAGNOSIS — R911 Solitary pulmonary nodule: Secondary | ICD-10-CM | POA: Diagnosis not present

## 2023-08-08 DIAGNOSIS — J479 Bronchiectasis, uncomplicated: Secondary | ICD-10-CM | POA: Diagnosis not present

## 2023-08-09 DIAGNOSIS — J479 Bronchiectasis, uncomplicated: Secondary | ICD-10-CM | POA: Diagnosis not present

## 2023-08-21 DIAGNOSIS — J479 Bronchiectasis, uncomplicated: Secondary | ICD-10-CM | POA: Diagnosis not present

## 2023-08-30 ENCOUNTER — Other Ambulatory Visit

## 2023-09-07 DIAGNOSIS — Z01419 Encounter for gynecological examination (general) (routine) without abnormal findings: Secondary | ICD-10-CM | POA: Diagnosis not present

## 2023-09-07 DIAGNOSIS — N959 Unspecified menopausal and perimenopausal disorder: Secondary | ICD-10-CM | POA: Diagnosis not present

## 2023-09-07 DIAGNOSIS — Z6830 Body mass index (BMI) 30.0-30.9, adult: Secondary | ICD-10-CM | POA: Diagnosis not present

## 2023-09-09 DIAGNOSIS — J479 Bronchiectasis, uncomplicated: Secondary | ICD-10-CM | POA: Diagnosis not present

## 2023-09-26 ENCOUNTER — Ambulatory Visit

## 2023-09-26 NOTE — Progress Notes (Signed)
 Patient presents today to pick up custom molded foot orthotics, diagnosed with pes planus by Dr. Allena Katz.   Orthotics were dispensed and fit was satisfactory. Reviewed instructions for break-in and wear. Written instructions given to patient.  Patient will follow up as needed.   Addison Bailey Cped, CFo, CFm

## 2023-10-09 DIAGNOSIS — J479 Bronchiectasis, uncomplicated: Secondary | ICD-10-CM | POA: Diagnosis not present

## 2023-11-07 DIAGNOSIS — N952 Postmenopausal atrophic vaginitis: Secondary | ICD-10-CM | POA: Diagnosis not present

## 2023-11-07 DIAGNOSIS — N3020 Other chronic cystitis without hematuria: Secondary | ICD-10-CM | POA: Diagnosis not present

## 2023-11-07 DIAGNOSIS — R3982 Chronic bladder pain: Secondary | ICD-10-CM | POA: Diagnosis not present

## 2023-11-09 DIAGNOSIS — J479 Bronchiectasis, uncomplicated: Secondary | ICD-10-CM | POA: Diagnosis not present

## 2023-11-12 DIAGNOSIS — K219 Gastro-esophageal reflux disease without esophagitis: Secondary | ICD-10-CM | POA: Diagnosis not present

## 2023-11-12 DIAGNOSIS — R911 Solitary pulmonary nodule: Secondary | ICD-10-CM | POA: Diagnosis not present

## 2023-11-12 DIAGNOSIS — R1314 Dysphagia, pharyngoesophageal phase: Secondary | ICD-10-CM | POA: Diagnosis not present

## 2023-11-12 DIAGNOSIS — J479 Bronchiectasis, uncomplicated: Secondary | ICD-10-CM | POA: Diagnosis not present

## 2023-11-27 DIAGNOSIS — H353121 Nonexudative age-related macular degeneration, left eye, early dry stage: Secondary | ICD-10-CM | POA: Diagnosis not present

## 2023-11-27 DIAGNOSIS — H353112 Nonexudative age-related macular degeneration, right eye, intermediate dry stage: Secondary | ICD-10-CM | POA: Diagnosis not present

## 2023-12-10 DIAGNOSIS — J479 Bronchiectasis, uncomplicated: Secondary | ICD-10-CM | POA: Diagnosis not present

## 2023-12-11 DIAGNOSIS — J302 Other seasonal allergic rhinitis: Secondary | ICD-10-CM | POA: Diagnosis not present

## 2023-12-11 DIAGNOSIS — K219 Gastro-esophageal reflux disease without esophagitis: Secondary | ICD-10-CM | POA: Diagnosis not present

## 2023-12-11 DIAGNOSIS — N390 Urinary tract infection, site not specified: Secondary | ICD-10-CM | POA: Diagnosis not present

## 2023-12-11 DIAGNOSIS — N951 Menopausal and female climacteric states: Secondary | ICD-10-CM | POA: Diagnosis not present

## 2023-12-11 DIAGNOSIS — J479 Bronchiectasis, uncomplicated: Secondary | ICD-10-CM | POA: Diagnosis not present

## 2023-12-11 DIAGNOSIS — I10 Essential (primary) hypertension: Secondary | ICD-10-CM | POA: Diagnosis not present

## 2023-12-11 DIAGNOSIS — Z91030 Bee allergy status: Secondary | ICD-10-CM | POA: Diagnosis not present

## 2023-12-11 DIAGNOSIS — B3731 Acute candidiasis of vulva and vagina: Secondary | ICD-10-CM | POA: Diagnosis not present

## 2023-12-11 DIAGNOSIS — F419 Anxiety disorder, unspecified: Secondary | ICD-10-CM | POA: Diagnosis not present

## 2023-12-11 DIAGNOSIS — E7800 Pure hypercholesterolemia, unspecified: Secondary | ICD-10-CM | POA: Diagnosis not present

## 2024-01-09 DIAGNOSIS — J479 Bronchiectasis, uncomplicated: Secondary | ICD-10-CM | POA: Diagnosis not present

## 2024-02-08 DIAGNOSIS — R911 Solitary pulmonary nodule: Secondary | ICD-10-CM | POA: Diagnosis not present

## 2024-03-05 DIAGNOSIS — H65191 Other acute nonsuppurative otitis media, right ear: Secondary | ICD-10-CM | POA: Diagnosis not present

## 2024-03-05 DIAGNOSIS — R051 Acute cough: Secondary | ICD-10-CM | POA: Diagnosis not present

## 2024-03-05 DIAGNOSIS — J0110 Acute frontal sinusitis, unspecified: Secondary | ICD-10-CM | POA: Diagnosis not present

## 2024-03-05 DIAGNOSIS — R42 Dizziness and giddiness: Secondary | ICD-10-CM | POA: Diagnosis not present

## 2024-04-17 ENCOUNTER — Other Ambulatory Visit: Payer: Self-pay | Admitting: Obstetrics and Gynecology

## 2024-04-17 DIAGNOSIS — Z1231 Encounter for screening mammogram for malignant neoplasm of breast: Secondary | ICD-10-CM

## 2024-06-02 ENCOUNTER — Ambulatory Visit
